# Patient Record
Sex: Male | Born: 1940
Health system: Southern US, Community
[De-identification: ages and names within clinical notes are randomized; demographics above are authoritative.]

## PROBLEM LIST (undated history)

## (undated) DIAGNOSIS — M1990 Unspecified osteoarthritis, unspecified site: Secondary | ICD-10-CM

## (undated) DIAGNOSIS — C439 Malignant melanoma of skin, unspecified: Secondary | ICD-10-CM

## (undated) DIAGNOSIS — I10 Essential (primary) hypertension: Secondary | ICD-10-CM

## (undated) DIAGNOSIS — S4990XA Unspecified injury of shoulder and upper arm, unspecified arm, initial encounter: Secondary | ICD-10-CM

## (undated) DIAGNOSIS — H9190 Unspecified hearing loss, unspecified ear: Secondary | ICD-10-CM

## (undated) DIAGNOSIS — G4730 Sleep apnea, unspecified: Secondary | ICD-10-CM

## (undated) DIAGNOSIS — R0683 Snoring: Secondary | ICD-10-CM

## (undated) DIAGNOSIS — E109 Type 1 diabetes mellitus without complications: Secondary | ICD-10-CM

## (undated) DIAGNOSIS — F419 Anxiety disorder, unspecified: Secondary | ICD-10-CM

## (undated) DIAGNOSIS — R351 Nocturia: Secondary | ICD-10-CM

## (undated) DIAGNOSIS — C61 Malignant neoplasm of prostate: Secondary | ICD-10-CM

## (undated) DIAGNOSIS — N393 Stress incontinence (female) (male): Secondary | ICD-10-CM

## (undated) DIAGNOSIS — M25511 Pain in right shoulder: Secondary | ICD-10-CM

## (undated) DIAGNOSIS — E785 Hyperlipidemia, unspecified: Secondary | ICD-10-CM

## (undated) DIAGNOSIS — K439 Ventral hernia without obstruction or gangrene: Secondary | ICD-10-CM

## (undated) DIAGNOSIS — Z8669 Personal history of other diseases of the nervous system and sense organs: Secondary | ICD-10-CM

## (undated) DIAGNOSIS — N529 Male erectile dysfunction, unspecified: Secondary | ICD-10-CM

## (undated) HISTORY — DX: Stress incontinence (female) (male): N39.3

## (undated) HISTORY — DX: Unspecified hearing loss, unspecified ear: H91.90

## (undated) HISTORY — DX: Male erectile dysfunction, unspecified: N52.9

## (undated) HISTORY — DX: Personal history of other diseases of the nervous system and sense organs: Z86.69

## (undated) HISTORY — DX: Malignant neoplasm of prostate: C61

## (undated) HISTORY — DX: Essential (primary) hypertension: I10

## (undated) HISTORY — PX: HERNIA REPAIR: SHX51

## (undated) HISTORY — DX: Ventral hernia without obstruction or gangrene: K43.9

## (undated) HISTORY — DX: Hyperlipidemia, unspecified: E78.5

## (undated) HISTORY — PX: ROTATOR CUFF REPAIR: SHX139

---

## 1998-07-24 ENCOUNTER — Encounter: Admission: RE | Admit: 1998-07-24 | Discharge: 1998-10-22 | Payer: Self-pay | Admitting: Endocrinology

## 1998-11-14 ENCOUNTER — Encounter: Payer: Self-pay | Admitting: Orthopedic Surgery

## 1998-11-14 ENCOUNTER — Ambulatory Visit (HOSPITAL_COMMUNITY): Admission: RE | Admit: 1998-11-14 | Discharge: 1998-11-14 | Payer: Self-pay | Admitting: Orthopedic Surgery

## 1999-08-09 ENCOUNTER — Ambulatory Visit: Admission: RE | Admit: 1999-08-09 | Discharge: 1999-08-09 | Payer: Self-pay | Admitting: Internal Medicine

## 2002-06-07 ENCOUNTER — Encounter: Admission: RE | Admit: 2002-06-07 | Discharge: 2002-09-05 | Payer: Self-pay | Admitting: Endocrinology

## 2002-09-29 ENCOUNTER — Encounter: Admission: RE | Admit: 2002-09-29 | Discharge: 2002-12-28 | Payer: Self-pay | Admitting: Endocrinology

## 2003-02-14 ENCOUNTER — Encounter: Admission: RE | Admit: 2003-02-14 | Discharge: 2003-05-15 | Payer: Self-pay | Admitting: Endocrinology

## 2006-06-26 ENCOUNTER — Ambulatory Visit: Payer: Self-pay | Admitting: Vascular Surgery

## 2009-01-17 ENCOUNTER — Ambulatory Visit (HOSPITAL_COMMUNITY): Admission: RE | Admit: 2009-01-17 | Discharge: 2009-01-17 | Payer: Self-pay | Admitting: Urology

## 2009-01-30 ENCOUNTER — Ambulatory Visit: Admission: RE | Admit: 2009-01-30 | Discharge: 2009-04-20 | Payer: Self-pay | Admitting: Radiation Oncology

## 2009-03-21 ENCOUNTER — Encounter (INDEPENDENT_AMBULATORY_CARE_PROVIDER_SITE_OTHER): Payer: Self-pay | Admitting: Urology

## 2009-03-21 ENCOUNTER — Inpatient Hospital Stay (HOSPITAL_COMMUNITY): Admission: RE | Admit: 2009-03-21 | Discharge: 2009-03-24 | Payer: Self-pay | Admitting: Urology

## 2010-02-23 HISTORY — PX: PROSTATE SURGERY: SHX751

## 2010-08-29 LAB — URINALYSIS, ROUTINE W REFLEX MICROSCOPIC
Bilirubin Urine: NEGATIVE
Glucose, UA: 500 mg/dL — AB
Hgb urine dipstick: NEGATIVE
Ketones, ur: NEGATIVE mg/dL
Nitrite: NEGATIVE
Protein, ur: NEGATIVE mg/dL
Specific Gravity, Urine: 1.006 (ref 1.005–1.030)
Urobilinogen, UA: 1 mg/dL (ref 0.0–1.0)
pH: 6.5 (ref 5.0–8.0)

## 2010-08-29 LAB — BASIC METABOLIC PANEL
BUN: 14 mg/dL (ref 6–23)
BUN: 25 mg/dL — ABNORMAL HIGH (ref 6–23)
CO2: 22 mEq/L (ref 19–32)
CO2: 25 mEq/L (ref 19–32)
Calcium: 7.5 mg/dL — ABNORMAL LOW (ref 8.4–10.5)
Calcium: 8.4 mg/dL (ref 8.4–10.5)
Chloride: 103 mEq/L (ref 96–112)
Chloride: 97 mEq/L (ref 96–112)
Chloride: 99 mEq/L (ref 96–112)
Creatinine, Ser: 1.52 mg/dL — ABNORMAL HIGH (ref 0.4–1.5)
Creatinine, Ser: 2.37 mg/dL — ABNORMAL HIGH (ref 0.4–1.5)
GFR calc Af Amer: 31 mL/min — ABNORMAL LOW (ref >60)
GFR calc Af Amer: 33 mL/min — ABNORMAL LOW (ref >60)
GFR calc Af Amer: 56 mL/min — ABNORMAL LOW (ref >60)
GFR calc non Af Amer: 27 mL/min — ABNORMAL LOW (ref >60)
GFR calc non Af Amer: 46 mL/min — ABNORMAL LOW (ref >60)
Glucose, Bld: 265 mg/dL — ABNORMAL HIGH (ref 70–99)
Potassium: 4 mEq/L (ref 3.5–5.1)
Potassium: 4.3 mEq/L (ref 3.5–5.1)
Sodium: 127 mEq/L — ABNORMAL LOW (ref 135–145)
Sodium: 133 mEq/L — ABNORMAL LOW (ref 135–145)

## 2010-08-29 LAB — COMPREHENSIVE METABOLIC PANEL
ALT: 21 U/L (ref 0–53)
AST: 29 U/L (ref 0–37)
Albumin: 3.8 g/dL (ref 3.5–5.2)
Alkaline Phosphatase: 64 U/L (ref 39–117)
Alkaline Phosphatase: 71 U/L (ref 39–117)
BUN: 13 mg/dL (ref 6–23)
BUN: 25 mg/dL — ABNORMAL HIGH (ref 6–23)
BUN: 27 mg/dL — ABNORMAL HIGH (ref 6–23)
CO2: 23 mEq/L (ref 19–32)
CO2: 28 mEq/L (ref 19–32)
Calcium: 8 mg/dL — ABNORMAL LOW (ref 8.4–10.5)
Calcium: 9.1 mg/dL (ref 8.4–10.5)
Chloride: 100 mEq/L (ref 96–112)
Chloride: 99 mEq/L (ref 96–112)
Creatinine, Ser: 1.18 mg/dL (ref 0.4–1.5)
Creatinine, Ser: 2.62 mg/dL — ABNORMAL HIGH (ref 0.4–1.5)
Creatinine, Ser: 2.8 mg/dL — ABNORMAL HIGH (ref 0.4–1.5)
GFR calc Af Amer: >60 mL/min (ref >60)
GFR calc non Af Amer: 25 mL/min — ABNORMAL LOW (ref >60)
GFR calc non Af Amer: >60 mL/min (ref >60)
Glucose, Bld: 251 mg/dL — ABNORMAL HIGH (ref 70–99)
Glucose, Bld: 335 mg/dL — ABNORMAL HIGH (ref 70–99)
Potassium: 3.8 mEq/L (ref 3.5–5.1)
Potassium: 3.8 mEq/L (ref 3.5–5.1)
Sodium: 135 mEq/L (ref 135–145)
Total Bilirubin: 0.6 mg/dL (ref 0.3–1.2)
Total Bilirubin: 1 mg/dL (ref 0.3–1.2)
Total Protein: 6.5 g/dL (ref 6.0–8.3)
Total Protein: 7.6 g/dL (ref 6.0–8.3)

## 2010-08-29 LAB — CBC
HCT: 36.2 % — ABNORMAL LOW (ref 39.0–52.0)
HCT: 37.1 % — ABNORMAL LOW (ref 39.0–52.0)
Hemoglobin: 12.8 g/dL — ABNORMAL LOW (ref 13.0–17.0)
MCHC: 35.4 g/dL (ref 30.0–36.0)
MCV: 91.6 fL (ref 78.0–100.0)
MCV: 92 fL (ref 78.0–100.0)
MCV: 92.2 fL (ref 78.0–100.0)
Platelets: 110 10*3/uL — ABNORMAL LOW (ref 150–400)
Platelets: 122 10*3/uL — ABNORMAL LOW (ref 150–400)
Platelets: 142 10*3/uL — ABNORMAL LOW (ref 150–400)
RBC: 3.5 MIL/uL — ABNORMAL LOW (ref 4.22–5.81)
RBC: 3.92 MIL/uL — ABNORMAL LOW (ref 4.22–5.81)
RBC: 4.25 MIL/uL (ref 4.22–5.81)
RDW: 13.1 % (ref 11.5–15.5)
WBC: 10.4 10*3/uL (ref 4.0–10.5)
WBC: 7 10*3/uL (ref 4.0–10.5)
WBC: 9.4 10*3/uL (ref 4.0–10.5)

## 2010-08-29 LAB — ABO/RH: ABO/RH(D): O POS

## 2010-08-29 LAB — GLUCOSE, CAPILLARY
Glucose-Capillary: 128 mg/dL — ABNORMAL HIGH (ref 70–99)
Glucose-Capillary: 144 mg/dL — ABNORMAL HIGH (ref 70–99)
Glucose-Capillary: 146 mg/dL — ABNORMAL HIGH (ref 70–99)
Glucose-Capillary: 155 mg/dL — ABNORMAL HIGH (ref 70–99)
Glucose-Capillary: 164 mg/dL — ABNORMAL HIGH (ref 70–99)
Glucose-Capillary: 167 mg/dL — ABNORMAL HIGH (ref 70–99)
Glucose-Capillary: 170 mg/dL — ABNORMAL HIGH (ref 70–99)
Glucose-Capillary: 189 mg/dL — ABNORMAL HIGH (ref 70–99)
Glucose-Capillary: 205 mg/dL — ABNORMAL HIGH (ref 70–99)
Glucose-Capillary: 226 mg/dL — ABNORMAL HIGH (ref 70–99)
Glucose-Capillary: 228 mg/dL — ABNORMAL HIGH (ref 70–99)
Glucose-Capillary: 235 mg/dL — ABNORMAL HIGH (ref 70–99)
Glucose-Capillary: 236 mg/dL — ABNORMAL HIGH (ref 70–99)
Glucose-Capillary: 254 mg/dL — ABNORMAL HIGH (ref 70–99)
Glucose-Capillary: 266 mg/dL — ABNORMAL HIGH (ref 70–99)
Glucose-Capillary: 273 mg/dL — ABNORMAL HIGH (ref 70–99)
Glucose-Capillary: 321 mg/dL — ABNORMAL HIGH (ref 70–99)
Glucose-Capillary: 333 mg/dL — ABNORMAL HIGH (ref 70–99)
Glucose-Capillary: 370 mg/dL — ABNORMAL HIGH (ref 70–99)
Glucose-Capillary: 378 mg/dL — ABNORMAL HIGH (ref 70–99)
Glucose-Capillary: 390 mg/dL — ABNORMAL HIGH (ref 70–99)

## 2010-08-29 LAB — DIFFERENTIAL
Eosinophils Absolute: 0 10*3/uL (ref 0.0–0.7)
Eosinophils Relative: 0 % (ref 0–5)
Lymphocytes Relative: 6 % — ABNORMAL LOW (ref 12–46)
Lymphs Abs: 0.4 10*3/uL — ABNORMAL LOW (ref 0.7–4.0)
Monocytes Absolute: 0.6 10*3/uL (ref 0.1–1.0)

## 2010-08-29 LAB — TYPE AND SCREEN: ABO/RH(D): O POS

## 2010-08-29 LAB — PROTIME-INR
INR: 0.98 (ref 0.00–1.49)
Prothrombin Time: 12.9 seconds (ref 11.6–15.2)

## 2011-04-28 HISTORY — PX: EYE SURGERY: SHX253

## 2011-06-12 DIAGNOSIS — R05 Cough: Secondary | ICD-10-CM | POA: Diagnosis not present

## 2011-06-12 DIAGNOSIS — E1029 Type 1 diabetes mellitus with other diabetic kidney complication: Secondary | ICD-10-CM | POA: Diagnosis not present

## 2011-06-12 DIAGNOSIS — J069 Acute upper respiratory infection, unspecified: Secondary | ICD-10-CM | POA: Diagnosis not present

## 2011-06-12 DIAGNOSIS — R509 Fever, unspecified: Secondary | ICD-10-CM | POA: Diagnosis not present

## 2011-06-25 DIAGNOSIS — H35379 Puckering of macula, unspecified eye: Secondary | ICD-10-CM | POA: Diagnosis not present

## 2011-06-25 DIAGNOSIS — H3581 Retinal edema: Secondary | ICD-10-CM | POA: Diagnosis not present

## 2011-06-27 DIAGNOSIS — E1029 Type 1 diabetes mellitus with other diabetic kidney complication: Secondary | ICD-10-CM | POA: Diagnosis not present

## 2011-06-27 DIAGNOSIS — J209 Acute bronchitis, unspecified: Secondary | ICD-10-CM | POA: Diagnosis not present

## 2011-06-27 DIAGNOSIS — J069 Acute upper respiratory infection, unspecified: Secondary | ICD-10-CM | POA: Diagnosis not present

## 2011-06-27 DIAGNOSIS — N058 Unspecified nephritic syndrome with other morphologic changes: Secondary | ICD-10-CM | POA: Diagnosis not present

## 2011-08-19 DIAGNOSIS — I10 Essential (primary) hypertension: Secondary | ICD-10-CM | POA: Diagnosis not present

## 2011-08-19 DIAGNOSIS — D126 Benign neoplasm of colon, unspecified: Secondary | ICD-10-CM | POA: Diagnosis not present

## 2011-08-19 DIAGNOSIS — E785 Hyperlipidemia, unspecified: Secondary | ICD-10-CM | POA: Diagnosis not present

## 2011-08-19 DIAGNOSIS — E1029 Type 1 diabetes mellitus with other diabetic kidney complication: Secondary | ICD-10-CM | POA: Diagnosis not present

## 2011-08-20 ENCOUNTER — Encounter (INDEPENDENT_AMBULATORY_CARE_PROVIDER_SITE_OTHER): Payer: Self-pay | Admitting: General Surgery

## 2011-08-20 DIAGNOSIS — H2510 Age-related nuclear cataract, unspecified eye: Secondary | ICD-10-CM | POA: Diagnosis not present

## 2011-08-20 DIAGNOSIS — E1139 Type 2 diabetes mellitus with other diabetic ophthalmic complication: Secondary | ICD-10-CM | POA: Diagnosis not present

## 2011-08-20 DIAGNOSIS — E11339 Type 2 diabetes mellitus with moderate nonproliferative diabetic retinopathy without macular edema: Secondary | ICD-10-CM | POA: Diagnosis not present

## 2011-08-20 DIAGNOSIS — H35379 Puckering of macula, unspecified eye: Secondary | ICD-10-CM | POA: Diagnosis not present

## 2011-08-21 ENCOUNTER — Encounter (INDEPENDENT_AMBULATORY_CARE_PROVIDER_SITE_OTHER): Payer: Self-pay | Admitting: General Surgery

## 2011-08-21 ENCOUNTER — Ambulatory Visit (INDEPENDENT_AMBULATORY_CARE_PROVIDER_SITE_OTHER): Payer: Medicare Other | Admitting: General Surgery

## 2011-08-21 VITALS — BP 172/66 | HR 79 | Temp 97.6°F | Ht 67.0 in | Wt 179.6 lb

## 2011-08-21 DIAGNOSIS — K439 Ventral hernia without obstruction or gangrene: Secondary | ICD-10-CM | POA: Diagnosis not present

## 2011-08-21 NOTE — Progress Notes (Signed)
Subjective:     Patient ID: Eric [name], male   DOB: [birth year], 71 y.o.   MRN: [id number]  HPI We're asked to see the patient in consultation by Dr. Darvin Neighbours to evaluate him for a ventral hernia. The patient is a 71 year old white male who had robotic prostate surgery for prostate cancer about 2 years ago. Shortly thereafter he started noticing a bulge just above his umbilicus at one of the port sites. He recently had an upper respiratory infection and a lot of coughing and noticed that the bulge was getting a little larger. He denies any pain. He denies any abdominal bloating. He denies any nausea and vomiting.  Review of Systems  Constitutional: Negative.   HENT: Negative.   Eyes: Negative.   Respiratory: Negative.   Cardiovascular: Negative.   Gastrointestinal: Negative.   Genitourinary: Negative.   Musculoskeletal: Negative.   Skin: Negative.   Neurological: Negative.   Hematological: Negative.   Psychiatric/Behavioral: Negative.        Objective:   Physical Exam  Constitutional: He is oriented to person, place, and time. He appears well-developed and well-nourished.  HENT:  Head: Normocephalic and atraumatic.  Eyes: Conjunctivae and EOM are normal. Pupils are equal, round, and reactive to light.  Neck: Normal range of motion. Neck supple.  Cardiovascular: Normal rate, regular rhythm and normal heart sounds.   Pulmonary/Chest: Effort normal and breath sounds normal.  Abdominal: Soft. Bowel sounds are normal.       He has a moderate sized ventral hernia at his supraumbilical port site that is easily reducible.  Musculoskeletal: Normal range of motion.  Neurological: He is alert and oriented to person, place, and time.  Skin: Skin is warm and dry.  Psychiatric: He has a normal mood and affect. His behavior is normal.       Assessment:     Moderate size ventral hernia that recently got a little larger after an upper respiratory infection    Plan:     Because of the  risk of incarceration and strangulation and the fact that the natural history of these hernias is to gradually enlarge think he would benefit from having it fixed. He would also like to have this done. I've discussed in detail the risks and benefits of the operation to fix the hernia as well as some of the technical aspects he understands and wishes to proceed. I think he would be a good laparoscopic candidate. 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 As long as he does not seem to have any symptoms I think that would be fine. He agrees to call if he starts having pain bloating and nausea and vomiting. He will also call and is ready to schedule.

## 2011-08-21 NOTE — Patient Instructions (Signed)
Call when you are ready to schedule laparoscopic assisted ventral hernia repair with mesh

## 2011-09-02 DIAGNOSIS — R05 Cough: Secondary | ICD-10-CM | POA: Diagnosis not present

## 2011-09-02 DIAGNOSIS — R109 Unspecified abdominal pain: Secondary | ICD-10-CM | POA: Diagnosis not present

## 2011-09-02 DIAGNOSIS — IMO0001 Reserved for inherently not codable concepts without codable children: Secondary | ICD-10-CM | POA: Diagnosis not present

## 2011-09-02 DIAGNOSIS — J069 Acute upper respiratory infection, unspecified: Secondary | ICD-10-CM | POA: Diagnosis not present

## 2011-09-03 DIAGNOSIS — K429 Umbilical hernia without obstruction or gangrene: Secondary | ICD-10-CM | POA: Diagnosis not present

## 2011-09-03 DIAGNOSIS — R109 Unspecified abdominal pain: Secondary | ICD-10-CM | POA: Diagnosis not present

## 2011-09-22 DIAGNOSIS — R221 Localized swelling, mass and lump, neck: Secondary | ICD-10-CM | POA: Diagnosis not present

## 2011-09-22 DIAGNOSIS — X58XXXA Exposure to other specified factors, initial encounter: Secondary | ICD-10-CM | POA: Diagnosis not present

## 2011-09-22 DIAGNOSIS — T783XXA Angioneurotic edema, initial encounter: Secondary | ICD-10-CM | POA: Diagnosis not present

## 2011-09-22 DIAGNOSIS — R220 Localized swelling, mass and lump, head: Secondary | ICD-10-CM | POA: Diagnosis not present

## 2011-10-03 DIAGNOSIS — T7800XA Anaphylactic reaction due to unspecified food, initial encounter: Secondary | ICD-10-CM | POA: Diagnosis not present

## 2011-10-03 DIAGNOSIS — T7840XA Allergy, unspecified, initial encounter: Secondary | ICD-10-CM | POA: Diagnosis not present

## 2011-10-03 DIAGNOSIS — L500 Allergic urticaria: Secondary | ICD-10-CM | POA: Diagnosis not present

## 2011-10-03 DIAGNOSIS — T783XXA Angioneurotic edema, initial encounter: Secondary | ICD-10-CM | POA: Diagnosis not present

## 2011-10-15 DIAGNOSIS — T782XXA Anaphylactic shock, unspecified, initial encounter: Secondary | ICD-10-CM | POA: Diagnosis not present

## 2011-10-15 DIAGNOSIS — I10 Essential (primary) hypertension: Secondary | ICD-10-CM | POA: Diagnosis not present

## 2011-11-03 DIAGNOSIS — C61 Malignant neoplasm of prostate: Secondary | ICD-10-CM | POA: Diagnosis not present

## 2011-11-03 DIAGNOSIS — N393 Stress incontinence (female) (male): Secondary | ICD-10-CM | POA: Diagnosis not present

## 2011-11-03 DIAGNOSIS — R972 Elevated prostate specific antigen [PSA]: Secondary | ICD-10-CM | POA: Diagnosis not present

## 2011-11-18 DIAGNOSIS — H35379 Puckering of macula, unspecified eye: Secondary | ICD-10-CM | POA: Diagnosis not present

## 2011-11-18 DIAGNOSIS — E1139 Type 2 diabetes mellitus with other diabetic ophthalmic complication: Secondary | ICD-10-CM | POA: Diagnosis not present

## 2011-11-18 DIAGNOSIS — E11339 Type 2 diabetes mellitus with moderate nonproliferative diabetic retinopathy without macular edema: Secondary | ICD-10-CM | POA: Diagnosis not present

## 2011-11-18 DIAGNOSIS — H3581 Retinal edema: Secondary | ICD-10-CM | POA: Diagnosis not present

## 2011-11-25 ENCOUNTER — Encounter (INDEPENDENT_AMBULATORY_CARE_PROVIDER_SITE_OTHER): Payer: Medicare Other | Admitting: General Surgery

## 2011-11-26 DIAGNOSIS — N401 Enlarged prostate with lower urinary tract symptoms: Secondary | ICD-10-CM | POA: Diagnosis not present

## 2011-11-26 DIAGNOSIS — E785 Hyperlipidemia, unspecified: Secondary | ICD-10-CM | POA: Diagnosis not present

## 2011-11-26 DIAGNOSIS — N058 Unspecified nephritic syndrome with other morphologic changes: Secondary | ICD-10-CM | POA: Diagnosis not present

## 2011-11-26 DIAGNOSIS — E1029 Type 1 diabetes mellitus with other diabetic kidney complication: Secondary | ICD-10-CM | POA: Diagnosis not present

## 2011-12-11 ENCOUNTER — Ambulatory Visit (INDEPENDENT_AMBULATORY_CARE_PROVIDER_SITE_OTHER): Payer: Medicare Other | Admitting: General Surgery

## 2011-12-11 ENCOUNTER — Encounter (INDEPENDENT_AMBULATORY_CARE_PROVIDER_SITE_OTHER): Payer: Self-pay | Admitting: General Surgery

## 2011-12-11 VITALS — BP 141/80 | HR 72 | Temp 98.6°F | Resp 18 | Ht 67.0 in | Wt 180.6 lb

## 2011-12-11 DIAGNOSIS — K439 Ventral hernia without obstruction or gangrene: Secondary | ICD-10-CM

## 2011-12-11 NOTE — Progress Notes (Signed)
Subjective:     Patient ID: Eric [name], male   DOB: [birth year], 71 y.o.   MRN: [id number]  HPI The patient is a 71 year old white male who we saw a few months ago with a ventral hernia. He has a history of a robotic prostate surgery and the hernia is at his supraumbilical port site. He denies any abdominal pain. He denies any nausea or vomiting. He comes in today because he is ready to schedule surgery.  Review of Systems  Constitutional: Negative.   HENT: Negative.   Eyes: Negative.   Respiratory: Negative.   Cardiovascular: Negative.   Gastrointestinal: Negative.   Genitourinary: Negative.   Musculoskeletal: Negative.   Skin: Negative.   Neurological: Negative.   Hematological: Negative.   Psychiatric/Behavioral: Negative.        Objective:   Physical Exam  Constitutional: He is oriented to person, place, and time. He appears well-developed and well-nourished.  HENT:  Head: Normocephalic and atraumatic.  Eyes: Conjunctivae and EOM are normal. Pupils are equal, round, and reactive to light.  Neck: Normal range of motion. Neck supple.  Cardiovascular: Normal rate, regular rhythm and normal heart sounds.   Pulmonary/Chest: Effort normal and breath sounds normal.  Abdominal: Soft. Bowel sounds are normal.       The patient has an easily reducible ventral hernia at his supraumbilical port site. I can palpate the fascial defect.  Musculoskeletal: Normal range of motion.  Neurological: He is alert and oriented to person, place, and time.  Skin: Skin is warm and dry.  Psychiatric: He has a normal mood and affect. His behavior is normal.       Assessment:     The patient has a ventral hernia. I think he would be a good candidate for a laparoscopic ventral hernia repair with mesh. I have discussed with him in detail the risks and benefits of the operation of this as well as some of the technical aspects and he understands and wishes to proceed.    Plan:     Plan for  laparoscopic ventral hernia repair with mesh.

## 2011-12-17 ENCOUNTER — Encounter (HOSPITAL_COMMUNITY): Payer: Self-pay | Admitting: Respiratory Therapy

## 2011-12-25 ENCOUNTER — Encounter (HOSPITAL_COMMUNITY): Payer: Self-pay

## 2011-12-25 ENCOUNTER — Encounter (HOSPITAL_COMMUNITY)
Admission: RE | Admit: 2011-12-25 | Discharge: 2011-12-25 | Disposition: A | Discharge status: Home / Self Care | Payer: Medicare Other | Source: Ambulatory Visit | Attending: General Surgery | Admitting: General Surgery

## 2011-12-25 DIAGNOSIS — Z01812 Encounter for preprocedural laboratory examination: Secondary | ICD-10-CM | POA: Diagnosis not present

## 2011-12-25 DIAGNOSIS — N320 Bladder-neck obstruction: Secondary | ICD-10-CM | POA: Diagnosis not present

## 2011-12-25 DIAGNOSIS — K439 Ventral hernia without obstruction or gangrene: Secondary | ICD-10-CM | POA: Diagnosis not present

## 2011-12-25 DIAGNOSIS — Z01811 Encounter for preprocedural respiratory examination: Secondary | ICD-10-CM | POA: Diagnosis not present

## 2011-12-25 DIAGNOSIS — N368 Other specified disorders of urethra: Secondary | ICD-10-CM | POA: Diagnosis not present

## 2011-12-25 HISTORY — DX: Nocturia: R35.1

## 2011-12-25 HISTORY — DX: Unspecified injury of shoulder and upper arm, unspecified arm, initial encounter: S49.90XA

## 2011-12-25 HISTORY — DX: Anxiety disorder, unspecified: F41.9

## 2011-12-25 HISTORY — DX: Snoring: R06.83

## 2011-12-25 HISTORY — DX: Pain in right shoulder: M25.511

## 2011-12-25 LAB — SURGICAL PCR SCREEN
MRSA, PCR: NEGATIVE
Staphylococcus aureus: NEGATIVE

## 2011-12-25 LAB — CBC
HCT: 40.9 % (ref 39.0–52.0)
MCHC: 33.7 g/dL (ref 30.0–36.0)
MCV: 85.6 fL (ref 78.0–100.0)
RDW: 14.3 % (ref 11.5–15.5)

## 2011-12-25 LAB — BASIC METABOLIC PANEL WITH GFR
BUN: 20 mg/dL (ref 6–23)
CO2: 26 meq/L (ref 19–32)
Calcium: 9.6 mg/dL (ref 8.4–10.5)
Chloride: 101 meq/L (ref 96–112)
Creatinine, Ser: 1.06 mg/dL (ref 0.50–1.35)
GFR calc Af Amer: 81 mL/min — ABNORMAL LOW
GFR calc non Af Amer: 70 mL/min — ABNORMAL LOW
Glucose, Bld: 206 mg/dL — ABNORMAL HIGH (ref 70–99)
Potassium: 4.8 meq/L (ref 3.5–5.1)
Sodium: 138 meq/L (ref 135–145)

## 2011-12-25 MED ORDER — CHLORHEXIDINE GLUCONATE 4 % EX LIQD: 1.0000 "application " | Freq: Once | CUTANEOUS | Status: DC

## 2011-12-25 NOTE — Pre-Procedure Instructions (Signed)
20 TRI CHITTICK  12/25/2011   Your procedure is scheduled on:  12/31/11  Wednesday  Report to [facility] at 0630 AM.  Call this number if you have problems the morning of surgery: [phone]   Remember:   Do not eat food:After Midnight.  May have clear liquids:until Midnight .  Clear liquids include soda, tea, black coffee, apple or grape juice, broth.  Take these medicines the morning of surgery with A SIP OF WATER: norvasc  celexa  Insulin pump on basal rate  No boluses day of surgery .   Do not wear jewelry, make-up or nail polish.  Do not wear lotions, powders, or perfumes. You may wear deodorant.  Do not shave 48 hours prior to surgery. Men may shave face and neck.  Do not bring valuables to the hospital.  Contacts, dentures or bridgework may not be worn into surgery.  Leave suitcase in the car. After surgery it may be brought to your room.  For patients admitted to the hospital, checkout time is 11:00 AM the day of discharge.   Patients discharged the day of surgery will not be allowed to drive home.  Name and phone number of your driver: wife  Special Instructions: CHG Shower Use Special Wash: 1/2 bottle night before surgery and 1/2 bottle morning of surgery.   Please read over the following fact sheets that you were given: Pain Booklet, Coughing and Deep Breathing, Lab Information, MRSA Information and Surgical Site Infection Prevention

## 2011-12-25 NOTE — Progress Notes (Signed)
Pt will leave insulin pump on basal rate the  Morning of surgery.  And will not bolus himself... He was also asked to call Dr Evlyn Kanner to advise him of upcoming surgery ,and management of the pump .Marland Kitchen

## 2011-12-26 NOTE — Consult Note (Signed)
Anesthesia chart review: Patient is a 71 year old male scheduled for laparoscopic ventral hernia repair with mesh by Dr. Carolynne Edouard on 12/31/11. History includes nonsmoker, hypertension, prostate cancer s/p robotic surgery '10, anxiety, blurred vision, hyperlipidemia, diabetes mellitus, with insulin pump, snoring disorder.  PCP is Dr. Evlyn Kanner.    CXR on 12/25/11 showed: No active disease. Mild compression deformity mid thoracic spine of indeterminate age. Clinical correlation is necessary. Epic indicates Dr. Carolynne Edouard has already reviewed this result.   EKG on 12/25/11 showed SB @ 55 bpm with first degree AVB.   Labs noted.  He plans to leave his insulin pump on his basal rate the morning of surgery.  Check CBG on arrival.  Anticipate he can proceed as planned.  Shonna Chock, PA-C

## 2011-12-30 MED ORDER — CEFAZOLIN SODIUM-DEXTROSE 2-3 GM-% IV SOLR
2.0000 g | INTRAVENOUS | Status: AC
  Administered 2012-01-06: 2 g via INTRAVENOUS
  Filled 2011-12-30: qty 50

## 2011-12-31 ENCOUNTER — Telehealth (INDEPENDENT_AMBULATORY_CARE_PROVIDER_SITE_OTHER): Payer: Self-pay | Admitting: General Surgery

## 2011-12-31 NOTE — Telephone Encounter (Signed)
Pt wife called in stating that the surgery schedule for 01/02/12 8:00 or 8:30 was moved to 11:30. She was concerned about his blood sugar levels since they have to travel from [place]. She stated that she called the hospital and they told her to call [facility]. Call placed on hold and I called pre-admit at [facility] directly. Alcario Drought advised that the wife was already told to eat a healthy good size meal for dinner and a healthy snack and that that would be enough to keep the blood sugar levels balanced and that his levels would be checked at the hospital prior to surgery. Information was repeated to the wife who again expressed concern about his blood sugar level. Advised her the hospital indicated that is the protocol for all diabetics and that if he could eat the snack late at 11:30 or 11:45 and that should help as well.

## 2012-01-01 NOTE — Progress Notes (Signed)
NOTIFIED PATIENT OF TIME CHANG, WILL ARRIVE AT 930 AM  01-02-12.

## 2012-01-05 NOTE — Progress Notes (Signed)
Left message on machine that time of surgery has changed again and he needs to arrive at 0730 AM.  Ask if he would call [phone] to confirm that he received message.

## 2012-01-05 NOTE — Progress Notes (Addendum)
Wife called to confirm that pt would be here at 0730

## 2012-01-06 ENCOUNTER — Encounter (HOSPITAL_COMMUNITY)
Admission: RE | Disposition: A | Discharge status: Home / Self Care | Payer: Self-pay | Source: Ambulatory Visit | Attending: General Surgery

## 2012-01-06 ENCOUNTER — Encounter (HOSPITAL_COMMUNITY): Payer: Self-pay

## 2012-01-06 ENCOUNTER — Inpatient Hospital Stay (HOSPITAL_COMMUNITY)
Admission: RE | Admit: 2012-01-06 | Discharge: 2012-01-09 | DRG: 355 | Disposition: A | Discharge status: Home / Self Care | Payer: Medicare Other | Source: Ambulatory Visit | Attending: General Surgery | Admitting: General Surgery

## 2012-01-06 ENCOUNTER — Encounter (HOSPITAL_COMMUNITY): Payer: Self-pay | Admitting: Vascular Surgery

## 2012-01-06 ENCOUNTER — Encounter (HOSPITAL_COMMUNITY): Payer: Self-pay | Admitting: Anesthesiology

## 2012-01-06 ENCOUNTER — Ambulatory Visit (HOSPITAL_COMMUNITY): Payer: Medicare Other | Admitting: Vascular Surgery

## 2012-01-06 DIAGNOSIS — R109 Unspecified abdominal pain: Secondary | ICD-10-CM | POA: Diagnosis not present

## 2012-01-06 DIAGNOSIS — Z01812 Encounter for preprocedural laboratory examination: Secondary | ICD-10-CM | POA: Diagnosis not present

## 2012-01-06 DIAGNOSIS — E119 Type 2 diabetes mellitus without complications: Secondary | ICD-10-CM | POA: Diagnosis present

## 2012-01-06 DIAGNOSIS — N320 Bladder-neck obstruction: Secondary | ICD-10-CM | POA: Diagnosis present

## 2012-01-06 DIAGNOSIS — N368 Other specified disorders of urethra: Secondary | ICD-10-CM | POA: Diagnosis present

## 2012-01-06 DIAGNOSIS — K439 Ventral hernia without obstruction or gangrene: Secondary | ICD-10-CM | POA: Diagnosis not present

## 2012-01-06 DIAGNOSIS — R339 Retention of urine, unspecified: Secondary | ICD-10-CM | POA: Diagnosis not present

## 2012-01-06 DIAGNOSIS — E785 Hyperlipidemia, unspecified: Secondary | ICD-10-CM | POA: Diagnosis present

## 2012-01-06 DIAGNOSIS — I10 Essential (primary) hypertension: Secondary | ICD-10-CM | POA: Diagnosis not present

## 2012-01-06 DIAGNOSIS — C61 Malignant neoplasm of prostate: Secondary | ICD-10-CM | POA: Diagnosis not present

## 2012-01-06 HISTORY — PX: CYSTOSCOPY: SHX5120

## 2012-01-06 HISTORY — PX: VENTRAL HERNIA REPAIR: SHX424

## 2012-01-06 HISTORY — DX: Type 1 diabetes mellitus without complications: E10.9

## 2012-01-06 HISTORY — PX: CYSTOSCOPY WITH URETHRAL DILATATION: SHX5125

## 2012-01-06 LAB — GLUCOSE, CAPILLARY: Glucose-Capillary: 141 mg/dL — ABNORMAL HIGH (ref 70–99)

## 2012-01-06 SURGERY — REPAIR, HERNIA, VENTRAL, LAPAROSCOPIC
Anesthesia: General | Wound class: Clean

## 2012-01-06 MED ORDER — HYDROMORPHONE HCL PF 1 MG/ML IJ SOLN
INTRAMUSCULAR | Status: AC
  Filled 2012-01-06: qty 1

## 2012-01-06 MED ORDER — ASPIRIN 81 MG PO TABS: 81.0000 mg | ORAL_TABLET | Freq: Every day | ORAL | Status: DC

## 2012-01-06 MED ORDER — ACETAMINOPHEN 10 MG/ML IV SOLN
INTRAVENOUS | Status: AC
  Administered 2012-01-06: 1000 mg
  Filled 2012-01-06: qty 100

## 2012-01-06 MED ORDER — PROMETHAZINE HCL 25 MG/ML IJ SOLN: 6.2500 mg | INTRAMUSCULAR | Status: DC | PRN

## 2012-01-06 MED ORDER — SODIUM CHLORIDE 0.9 % IR SOLN
Status: DC | PRN
  Administered 2012-01-06: 1000 mL

## 2012-01-06 MED ORDER — SODIUM CHLORIDE 0.9 % IV SOLN
INTRAVENOUS | Status: DC
  Administered 2012-01-06: 08:00:00 via INTRAVENOUS

## 2012-01-06 MED ORDER — INSULIN ASPART 100 UNIT/ML ~~LOC~~ SOLN
7.0000 [IU] | Freq: Once | SUBCUTANEOUS | Status: AC
  Administered 2012-01-06: 7 [IU] via SUBCUTANEOUS

## 2012-01-06 MED ORDER — KCL IN DEXTROSE-NACL 20-5-0.9 MEQ/L-%-% IV SOLN
INTRAVENOUS | Status: DC
  Administered 2012-01-06: 18:00:00 via INTRAVENOUS
  Administered 2012-01-07: 75 mL/h via INTRAVENOUS
  Filled 2012-01-06 (×3): qty 1000

## 2012-01-06 MED ORDER — ONDANSETRON HCL 4 MG/2ML IJ SOLN
INTRAMUSCULAR | Status: DC | PRN
  Administered 2012-01-06: 4 mg via INTRAVENOUS

## 2012-01-06 MED ORDER — OXYCODONE HCL 5 MG PO TABS
10.0000 mg | ORAL_TABLET | Freq: Once | ORAL | Status: AC
  Administered 2012-01-06: 10 mg via ORAL

## 2012-01-06 MED ORDER — VECURONIUM BROMIDE 10 MG IV SOLR
INTRAVENOUS | Status: DC | PRN
  Administered 2012-01-06: 2 mg via INTRAVENOUS

## 2012-01-06 MED ORDER — HYDROMORPHONE HCL PF 1 MG/ML IJ SOLN
0.2500 mg | INTRAMUSCULAR | Status: DC | PRN
  Administered 2012-01-06 (×4): 0.5 mg via INTRAVENOUS

## 2012-01-06 MED ORDER — EPHEDRINE SULFATE 50 MG/ML IJ SOLN
INTRAMUSCULAR | Status: DC | PRN
  Administered 2012-01-06: 10 mg via INTRAVENOUS
  Administered 2012-01-06 (×2): 5 mg via INTRAVENOUS
  Administered 2012-01-06: 10 mg via INTRAVENOUS

## 2012-01-06 MED ORDER — INSULIN PUMP
Freq: Three times a day (TID) | SUBCUTANEOUS | Status: DC
  Administered 2012-01-07 (×3): via SUBCUTANEOUS
  Administered 2012-01-08: 13.4 via SUBCUTANEOUS
  Administered 2012-01-08: 11 via SUBCUTANEOUS
  Administered 2012-01-08: 13.5 via SUBCUTANEOUS
  Administered 2012-01-09: 08:00:00 via SUBCUTANEOUS
  Filled 2012-01-06: qty 1

## 2012-01-06 MED ORDER — ACETAMINOPHEN 10 MG/ML IV SOLN: 1000.0000 mg | Freq: Once | INTRAVENOUS | Status: DC

## 2012-01-06 MED ORDER — ONDANSETRON HCL 4 MG PO TABS: 4.0000 mg | ORAL_TABLET | Freq: Four times a day (QID) | ORAL | Status: DC | PRN

## 2012-01-06 MED ORDER — LIDOCAINE HCL (CARDIAC) 20 MG/ML IV SOLN
INTRAVENOUS | Status: DC | PRN
  Administered 2012-01-06: 80 mg via INTRAVENOUS

## 2012-01-06 MED ORDER — KCL IN DEXTROSE-NACL 20-5-0.45 MEQ/L-%-% IV SOLN
INTRAVENOUS | Status: AC
  Administered 2012-01-06: 1000 mL
  Filled 2012-01-06: qty 1000

## 2012-01-06 MED ORDER — AMLODIPINE BESYLATE 5 MG PO TABS
5.0000 mg | ORAL_TABLET | Freq: Every day | ORAL | Status: DC
  Administered 2012-01-07 – 2012-01-09 (×3): 5 mg via ORAL
  Filled 2012-01-06 (×3): qty 1

## 2012-01-06 MED ORDER — MEPERIDINE HCL 25 MG/ML IJ SOLN: 6.2500 mg | INTRAMUSCULAR | Status: DC | PRN

## 2012-01-06 MED ORDER — ASPIRIN EC 81 MG PO TBEC
81.0000 mg | DELAYED_RELEASE_TABLET | Freq: Every day | ORAL | Status: DC
  Administered 2012-01-07 – 2012-01-09 (×3): 81 mg via ORAL
  Filled 2012-01-06 (×3): qty 1

## 2012-01-06 MED ORDER — CITALOPRAM HYDROBROMIDE 20 MG PO TABS
20.0000 mg | ORAL_TABLET | Freq: Every day | ORAL | Status: DC
  Administered 2012-01-07 – 2012-01-09 (×3): 20 mg via ORAL
  Filled 2012-01-06 (×3): qty 1

## 2012-01-06 MED ORDER — ROCURONIUM BROMIDE 100 MG/10ML IV SOLN
INTRAVENOUS | Status: DC | PRN
  Administered 2012-01-06: 50 mg via INTRAVENOUS

## 2012-01-06 MED ORDER — SODIUM CHLORIDE 0.9 % IV SOLN
INTRAVENOUS | Status: DC | PRN
  Administered 2012-01-06 (×2): via INTRAVENOUS

## 2012-01-06 MED ORDER — ATORVASTATIN CALCIUM 20 MG PO TABS
20.0000 mg | ORAL_TABLET | Freq: Every day | ORAL | Status: DC
  Administered 2012-01-06 – 2012-01-08 (×3): 20 mg via ORAL
  Filled 2012-01-06 (×4): qty 1

## 2012-01-06 MED ORDER — HYDROCODONE-ACETAMINOPHEN 5-325 MG PO TABS
1.0000 | ORAL_TABLET | ORAL | Status: DC | PRN
  Administered 2012-01-07 (×2): 2 via ORAL
  Filled 2012-01-06 (×2): qty 2

## 2012-01-06 MED ORDER — OXYCODONE HCL 5 MG PO TABS
ORAL_TABLET | ORAL | Status: AC
  Filled 2012-01-06: qty 2

## 2012-01-06 MED ORDER — MIDAZOLAM HCL 5 MG/5ML IJ SOLN
INTRAMUSCULAR | Status: DC | PRN
  Administered 2012-01-06: 1 mg via INTRAVENOUS

## 2012-01-06 MED ORDER — INSULIN ASPART 100 UNIT/ML ~~LOC~~ SOLN
0.0000 [IU] | Freq: Three times a day (TID) | SUBCUTANEOUS | Status: DC
  Administered 2012-01-06: 15 [IU] via SUBCUTANEOUS

## 2012-01-06 MED ORDER — GLYCOPYRROLATE 0.2 MG/ML IJ SOLN
INTRAMUSCULAR | Status: DC | PRN
  Administered 2012-01-06: .6 mg via INTRAVENOUS
  Administered 2012-01-06: 0.1 mg via INTRAVENOUS
  Administered 2012-01-06: 0.2 mg via INTRAVENOUS

## 2012-01-06 MED ORDER — INSULIN ASPART 100 UNIT/ML ~~LOC~~ SOLN
SUBCUTANEOUS | Status: AC
  Filled 2012-01-06: qty 1

## 2012-01-06 MED ORDER — MORPHINE SULFATE 4 MG/ML IJ SOLN
4.0000 mg | INTRAMUSCULAR | Status: DC | PRN
  Administered 2012-01-06 (×2): 4 mg via INTRAVENOUS
  Filled 2012-01-06 (×2): qty 1

## 2012-01-06 MED ORDER — PROPOFOL 10 MG/ML IV EMUL
INTRAVENOUS | Status: DC | PRN
  Administered 2012-01-06: 170 mg via INTRAVENOUS

## 2012-01-06 MED ORDER — OMEGA-3-ACID ETHYL ESTERS 1 G PO CAPS
2.0000 g | ORAL_CAPSULE | Freq: Two times a day (BID) | ORAL | Status: DC
  Administered 2012-01-07 – 2012-01-08 (×4): 2 g via ORAL
  Filled 2012-01-06 (×11): qty 2

## 2012-01-06 MED ORDER — NEOSTIGMINE METHYLSULFATE 1 MG/ML IJ SOLN
INTRAMUSCULAR | Status: DC | PRN
  Administered 2012-01-06: 4 mg via INTRAVENOUS

## 2012-01-06 MED ORDER — STERILE WATER FOR IRRIGATION IR SOLN
Status: DC | PRN
  Administered 2012-01-06: 1

## 2012-01-06 MED ORDER — ONDANSETRON HCL 4 MG/2ML IJ SOLN: 4.0000 mg | Freq: Four times a day (QID) | INTRAMUSCULAR | Status: DC | PRN

## 2012-01-06 MED ORDER — FENTANYL CITRATE 0.05 MG/ML IJ SOLN
INTRAMUSCULAR | Status: DC | PRN
  Administered 2012-01-06 (×4): 50 ug via INTRAVENOUS

## 2012-01-06 MED ORDER — BUPIVACAINE-EPINEPHRINE 0.25% -1:200000 IJ SOLN
INTRAMUSCULAR | Status: DC | PRN
  Administered 2012-01-06: 19 mL

## 2012-01-06 SURGICAL SUPPLY — 57 items
ADH SKN CLS APL DERMABOND .7 (GAUZE/BANDAGES/DRESSINGS) ×1
APPLIER CLIP LOGIC TI 5 (MISCELLANEOUS) IMPLANT
APPLIER CLIP ROT 10 11.4 M/L (STAPLE)
APR CLP MED LRG 11.4X10 (STAPLE)
APR CLP MED LRG 33X5 (MISCELLANEOUS)
BANDAGE GAUZE ELAST BULKY 4 IN (GAUZE/BANDAGES/DRESSINGS) IMPLANT
BINDER ABD UNIV 12 45-62 (WOUND CARE) IMPLANT
BINDER ABDOMINAL 46IN 62IN (WOUND CARE) ×2
CANISTER SUCTION 2500CC (MISCELLANEOUS) IMPLANT
CATH COUDE FOLEY 5CC 14FR (CATHETERS) ×1 IMPLANT
CATH FOLEY 2W COUNCIL 5CC 16FR (CATHETERS) ×1 IMPLANT
CHLORAPREP W/TINT 26ML (MISCELLANEOUS) ×2 IMPLANT
CLIP APPLIE ROT 10 11.4 M/L (STAPLE) IMPLANT
CLOTH BEACON ORANGE TIMEOUT ST (SAFETY) ×2 IMPLANT
COVER SURGICAL LIGHT HANDLE (MISCELLANEOUS) ×2 IMPLANT
DECANTER SPIKE VIAL GLASS SM (MISCELLANEOUS) ×2 IMPLANT
DERMABOND ADVANCED (GAUZE/BANDAGES/DRESSINGS) ×1
DERMABOND ADVANCED .7 DNX12 (GAUZE/BANDAGES/DRESSINGS) ×1 IMPLANT
DEVICE SECURE STRAP 25 ABSORB (INSTRUMENTS) ×2 IMPLANT
DEVICE TROCAR PUNCTURE CLOSURE (ENDOMECHANICALS) ×2 IMPLANT
DRAPE INCISE IOBAN 66X45 STRL (DRAPES) ×2 IMPLANT
DRAPE UTILITY 15X26 W/TAPE STR (DRAPE) ×4 IMPLANT
ELECT CAUTERY BLADE 6.4 (BLADE) ×2 IMPLANT
ELECT REM PT RETURN 9FT ADLT (ELECTROSURGICAL) ×2
ELECTRODE REM PT RTRN 9FT ADLT (ELECTROSURGICAL) ×1 IMPLANT
GLOVE BIO SURGEON STRL SZ7.5 (GLOVE) ×2 IMPLANT
GOWN STRL NON-REIN LRG LVL3 (GOWN DISPOSABLE) ×6 IMPLANT
GUIDEWIRE STR DUAL SENSOR (WIRE) ×1 IMPLANT
KIT BASIN OR (CUSTOM PROCEDURE TRAY) ×2 IMPLANT
KIT ROOM TURNOVER OR (KITS) ×2 IMPLANT
MARKER SKIN DUAL TIP RULER LAB (MISCELLANEOUS) ×1 IMPLANT
MESH PHYSIO OVAL 10X15CM (Mesh General) ×1 IMPLANT
NDL SPNL 22GX3.5 QUINCKE BK (NEEDLE) ×1 IMPLANT
NEEDLE SPNL 22GX3.5 QUINCKE BK (NEEDLE) ×2 IMPLANT
NS IRRIG 1000ML POUR BTL (IV SOLUTION) ×2 IMPLANT
PACK CYSTOSCOPY (CUSTOM PROCEDURE TRAY) ×1 IMPLANT
PAD ARMBOARD 7.5X6 YLW CONV (MISCELLANEOUS) ×2 IMPLANT
PEN SKIN MARKING BROAD (MISCELLANEOUS) ×2 IMPLANT
PENCIL BUTTON HOLSTER BLD 10FT (ELECTRODE) ×2 IMPLANT
SCALPEL HARMONIC ACE (MISCELLANEOUS) IMPLANT
SCISSORS LAP 5X35 DISP (ENDOMECHANICALS) IMPLANT
SET IRRIG TUBING LAPAROSCOPIC (IRRIGATION / IRRIGATOR) IMPLANT
SLEEVE ENDOPATH XCEL 5M (ENDOMECHANICALS) ×2 IMPLANT
SPONGE GAUZE 4X4 12PLY (GAUZE/BANDAGES/DRESSINGS) ×1 IMPLANT
SUT MNCRL AB 4-0 PS2 18 (SUTURE) ×2 IMPLANT
SUT NOVA NAB DX-16 0-1 5-0 T12 (SUTURE) ×2 IMPLANT
SUT VIC AB 3-0 SH 27 (SUTURE) ×2
SUT VIC AB 3-0 SH 27XBRD (SUTURE) IMPLANT
TOWEL OR 17X24 6PK STRL BLUE (TOWEL DISPOSABLE) ×2 IMPLANT
TOWEL OR 17X26 10 PK STRL BLUE (TOWEL DISPOSABLE) ×2 IMPLANT
TRAY FOLEY CATH 14FR (SET/KITS/TRAYS/PACK) ×2 IMPLANT
TRAY LAPAROSCOPIC (CUSTOM PROCEDURE TRAY) ×2 IMPLANT
TROCAR XCEL BLUNT TIP 100MML (ENDOMECHANICALS) IMPLANT
TROCAR XCEL NON-BLD 11X100MML (ENDOMECHANICALS) ×2 IMPLANT
TROCAR XCEL NON-BLD 5MMX100MML (ENDOMECHANICALS) ×2 IMPLANT
TUBE CONNECTING 12X1/4 (SUCTIONS) ×1 IMPLANT
YANKAUER SUCT BULB TIP NO VENT (SUCTIONS) ×1 IMPLANT

## 2012-01-06 NOTE — Progress Notes (Signed)
Spoke to Dr. Krista Blue regarding patient being type I DM request no labs. NS and SQ insulin only.

## 2012-01-06 NOTE — H&P (View-Only) (Signed)
Subjective:     Patient ID: Eric [name], male   DOB: 03/27/1941, 71 y.o.   MRN: [id number]  HPI The patient is a 71-year-old white male who we saw a few months ago with a ventral hernia. He has a history of a robotic prostate surgery and the hernia is at his supraumbilical port site. He denies any abdominal pain. He denies any nausea or vomiting. He comes in today because he is ready to schedule surgery.  Review of Systems  Constitutional: Negative.   HENT: Negative.   Eyes: Negative.   Respiratory: Negative.   Cardiovascular: Negative.   Gastrointestinal: Negative.   Genitourinary: Negative.   Musculoskeletal: Negative.   Skin: Negative.   Neurological: Negative.   Hematological: Negative.   Psychiatric/Behavioral: Negative.        Objective:   Physical Exam  Constitutional: He is oriented to person, place, and time. He appears well-developed and well-nourished.  HENT:  Head: Normocephalic and atraumatic.  Eyes: Conjunctivae and EOM are normal. Pupils are equal, round, and reactive to light.  Neck: Normal range of motion. Neck supple.  Cardiovascular: Normal rate, regular rhythm and normal heart sounds.   Pulmonary/Chest: Effort normal and breath sounds normal.  Abdominal: Soft. Bowel sounds are normal.       The patient has an easily reducible ventral hernia at his supraumbilical port site. I can palpate the fascial defect.  Musculoskeletal: Normal range of motion.  Neurological: He is alert and oriented to person, place, and time.  Skin: Skin is warm and dry.  Psychiatric: He has a normal mood and affect. His behavior is normal.       Assessment:     The patient has a ventral hernia. I think he would be a good candidate for a laparoscopic ventral hernia repair with mesh. I have discussed with him in detail the risks and benefits of the operation of this as well as some of the technical aspects and he understands and wishes to proceed.    Plan:     Plan for  laparoscopic ventral hernia repair with mesh.      

## 2012-01-06 NOTE — Op Note (Signed)
Pre-operative diagnosis : bladder neck contracture  Postoperative diagnosis:same  Operation:Flex cysto and urethral dilation and passage of 16 F council cath.   Surgeon:  Kathie Rhodes. Patsi Sears, MD  First assistant:none  Anesthesia:  general  Preparation: After appropriate pre-anesthesia, the [pt was brought to the OR and placed upon the table in the supine position and given GET anesthesia. Foley cath could not be placed and Urology was consulted ( see consult note). Armband double checked  Review history: LARP 2010. Had been doing well .Followed by Dr. Earlene Plater. Not seen by [facility] since 2010.   Statement of  Likelihood of Success: Excellent. TIME-OUT observed.:  Procedure: flex cysto identified BNC. Dilated to 42F with [facility] dilators over a guidewire. # 16 Council cath passed. 10cc in balloon.

## 2012-01-06 NOTE — Op Note (Signed)
01/06/2012  12:28 PM  PATIENT:  Eric [name]  71 y.o. male  PRE-OPERATIVE DIAGNOSIS:  Ventral Hernia, bladder neck stricture  POST-OPERATIVE DIAGNOSIS:  Ventral Hernia, bladder neck stricture  PROCEDURE:  Procedure(s) (LRB): LAPAROSCOPIC VENTRAL HERNIA (N/A) INSERTION OF MESH (N/A) CYSTOSCOPY FLEXIBLE (N/A) CYSTOSCOPY WITH URETHRAL DILATATION (N/A)  SURGEON:  Surgeon(s) and Role: Panel 1:    * Robyne Askew, MD - Primary  Panel 2:    * Kathi Ludwig, MD - Primary  PHYSICIAN ASSISTANT:   ASSISTANTS: none   ANESTHESIA:   general  EBL:  Total I/O In: 1500 [I.V.:1500] Out: 250 [Urine:225; Blood:25]  BLOOD ADMINISTERED:none  DRAINS: none   LOCAL MEDICATIONS USED:  MARCAINE     SPECIMEN:  Source of Specimen:  hernia sac  DISPOSITION OF SPECIMEN:  PATHOLOGY  COUNTS:  YES  TOURNIQUET:  * No tourniquets in log *  DICTATION: .Dragon Dictation After informed consent was obtained the patient was brought to the operating room and placed in the supine position the operating table. Initially we were unable to place a Foley catheter due to his previous prostatectomy. We had to consult urology who placed a Foley using a cystoscope. This portion of the procedure will be dictated separately. After adequate induction of general anesthesia the patient's abdomen was prepped with ChloraPrep, allowed to dry, and draped in usual sterile manner. A site was chosen in the left upper quadrant for placement of a 5 mm port. This area was infiltrated with quarter percent Marcaine. A 5 mm Optiview was used to dissect bluntly through the layers of the abdominal wall under direct vision until access was gained to the abdominal cavity. The abdomen was insufflated with carbon dioxide without difficulty. The anterior, wall was free from adhesions. The hernia was identified just above the umbilicus. A spinal needle was used to estimate the size of the hernia defect. We then chose a piece of  Physiomesh that would completely cover the defect. We chose a 10 x 15 cm piece of mesh. A second 5 mm port was placed in the left mid abdomen under direct vision. A third port was placed in the right mid abdomen also under direct vision. 6 #1 Novafil stitches were placed at equidistant points around the edge of the mesh. Next the hernia was opened with a small midline incision through the middle of the hernia defect. The hernia sac was excised sharply with the electrocautery. The mesh was placed intra-abdominally. The fascia was then closed over top of the mesh with interrupted #1 Novafil stitches. The abdomen was then reinsufflated. 6 small stab incisions were made at points that corresponded to the stitches. A suture passer was used to bring the tails of 8 stitch through the appropriate hole. Each of these stitches was then tied down snugly and the tails were removed. We did have some bleeding at the right inferior anchor site but this stopped once the stitch was tied down. Once this was accomplished the mesh appeared to be in good position and snugly up against the abdominal wall. A secure strap tacking device was used to place tacks in between the anchor stitches so that there were no gaps. Once this was accomplished the mesh appeared to be in good position. The abdomen was inspected no other abnormalities were noted. The operative site appeared to be hemostatic. The gas was allowed to escape. The midline incision was then closed with a deep layer of 3-0 Vicryl stitches. The skin incisions were then  closed with interrupted 4-0 Monocryl subcuticular stitches. The stab incision holes were closed with Dermabond and Dermabond was used to coat the rest of the closures. The patient tolerated procedure well. At the end of the case all needle sponge and instrument counts were correct. The patient was then awakened and taken to recovery in stable condition.  PLAN OF CARE: Admit to inpatient   PATIENT DISPOSITION:   PACU - hemodynamically stable.   Delay start of Pharmacological VTE agent (>24hrs) due to surgical blood loss or risk of bleeding: yes

## 2012-01-06 NOTE — Preoperative (Signed)
Beta Blockers   Reason not to administer Beta Blockers:Not Applicable 

## 2012-01-06 NOTE — Progress Notes (Signed)
CBG- 294, pt. OR bound.

## 2012-01-06 NOTE — OR Nursing (Signed)
Dr Imelda Pillow procedures: Flexible Cystoscopy. Dilation of Bladder Neck Stricture. Insertion of Foley Catheter.

## 2012-01-06 NOTE — Anesthesia Postprocedure Evaluation (Signed)
  Anesthesia Post-op Note  Patient: Eric [name]  Procedure(s) Performed: Procedure(s) (LRB): LAPAROSCOPIC VENTRAL HERNIA (N/A) INSERTION OF MESH (N/A) CYSTOSCOPY FLEXIBLE (N/A) CYSTOSCOPY WITH URETHRAL DILATATION (N/A)  Patient Location: PACU  Anesthesia Type: General  Level of Consciousness: awake  Airway and Oxygen Therapy: Patient Spontanous Breathing  Post-op Pain: mild  Post-op Assessment: Post-op Vital signs reviewed, Patient's Cardiovascular Status Stable, Respiratory Function Stable, Patent Airway, No signs of Nausea or vomiting and Pain level controlled  Post-op Vital Signs: stable  Complications: No apparent anesthesia complications

## 2012-01-06 NOTE — Consult Note (Signed)
Urology Consult  Referring physician: toth Reason for referral: acute urinary retention  Chief Complaint: acute urinary retention  History of Present Illness: 71 yo male RALP 2010 for G 3+3, PT2c, pN0, pMx CaP. PSA fell to 0.06 when last seen in 2010.  Pt now having hernia repair, with bladder neck contracture and inability to pass foley cath x 2 attempts.   Past Medical History  Diagnosis Date  . Hypertension   . Stress incontinence, male   . Malignant neoplasm prostate   . Impotence of organic origin   . Hearing problem   . History of blurred vision   . Ventral hernia   . Hyperlipidemia   . Anxiety   . Diabetes mellitus     insulin pump  . Shoulder injury     continues to have occ pain  . Nocturia   . Right shoulder pain     "due to prior surgery"  . Snoring disorder     "loud " per wife.   . Type I diabetes mellitus    Past Surgical History  Procedure Date  . Prostate surgery 02/2010  . Eye surgery 12& 07/2010    macular pucker    Medications: I have reviewed the patient's current medications. Allergies:  Allergies  Allergen Reactions  . Beef-Derived Products Swelling    NO BEEF,PORK & LAMB    Family History  Problem Relation Age of Onset  . Cancer    . Heart disease    . Diabetes    . Stroke Mother   . Heart disease Father    Social History:  reports that he has never smoked. He does not have any smokeless tobacco history on file. He reports that he does not drink alcohol or use illicit drugs.  ROS: All systems are reviewed and negative except as noted. Pt asleep.   Physical Exam:  Vital signs in last 24 hours: Temp:  [97.9 F (36.6 C)] 97.9 F (36.6 C) (08/13 0739) Pulse Rate:  [62] 62  (08/13 0739) Resp:  [18] 18  (08/13 0739) BP: (176)/(91) 176/91 mmHg (08/13 0739) SpO2:  [98 %] 98 % (08/13 0739)  Cardiovascular: Skin warm; not flushed Respiratory: Breaths quiet; no shortness of breath Abdomen: No masses Neurological: Normal sensation to  touch Musculoskeletal: Normal motor function arms and legs Lymphatics: No inguinal adenopathy Skin: No rashes Genitourinary:normal scrotum. Normal testes.  Normal penis.   Laboratory Data:  Results for orders placed during the hospital encounter of 01/06/12 (from the past 72 hour(s))  GLUCOSE, CAPILLARY     Status: Abnormal   Collection Time   01/06/12  7:43 AM      Component Value Range Comment   Glucose-Capillary 367 (*) 70 - 99 mg/dL   GLUCOSE, CAPILLARY     Status: Abnormal   Collection Time   01/06/12  8:41 AM      Component Value Range Comment   Glucose-Capillary 294 (*) 70 - 99 mg/dL    Comment 1 Notify RN     GLUCOSE, CAPILLARY     Status: Abnormal   Collection Time   01/06/12  9:11 AM      Component Value Range Comment   Glucose-Capillary 281 (*) 70 - 99 mg/dL    No results found for this or any previous visit (from the past 240 hour(s)). Creatinine: No results found for this basename: CREATININE:7 in the last 168 hours  Xrays: See report/chart   Impression/Assessment:  Probable BNC. Will need cysto and catheter.  Plan:  Flex cysto and cath.   Vlad Mayberry I 01/06/2012, 11:05 AM

## 2012-01-06 NOTE — Progress Notes (Signed)
Report received form Nathaneil Canary

## 2012-01-06 NOTE — Interval H&P Note (Signed)
History and Physical Interval Note:  01/06/2012 9:04 AM  Eric [name]  has presented today for surgery, with the diagnosis of Ventral Hernia  The various methods of treatment have been discussed with the patient and family. After consideration of risks, benefits and other options for treatment, the patient has consented to  Procedure(s) (LRB): LAPAROSCOPIC VENTRAL HERNIA (N/A) INSERTION OF MESH (N/A) as a surgical intervention .  The patient's history has been reviewed, patient examined, no change in status, stable for surgery.  I have reviewed the patient's chart and labs.  Questions were answered to the patient's satisfaction.     TOTH III,Marilyn Nihiser S

## 2012-01-06 NOTE — Anesthesia Preprocedure Evaluation (Signed)
Anesthesia Evaluation  Patient identified by MRN, date of birth, ID band Patient awake    Reviewed: Allergy & Precautions, H&P , NPO status , Patient's Chart, lab work & pertinent test results  History of Anesthesia Complications Negative for: history of anesthetic complications  Airway Mallampati: II  Neck ROM: Full    Dental  (+) Partial Lower and Partial Upper   Pulmonary neg pulmonary ROS,  breath sounds clear to auscultation        Cardiovascular hypertension, Rhythm:Regular Rate:Normal     Neuro/Psych PSYCHIATRIC DISORDERS Anxiety negative neurological ROS     GI/Hepatic negative GI ROS, Neg liver ROS,   Endo/Other    Renal/GU negative Renal ROS     Musculoskeletal   Abdominal (+) + obese,   Peds  Hematology   Anesthesia Other Findings   Reproductive/Obstetrics                           Anesthesia Physical Anesthesia Plan  ASA: III  Anesthesia Plan: General   Post-op Pain Management:    Induction: Intravenous  Airway Management Planned: Oral ETT  Additional Equipment:   Intra-op Plan:   Post-operative Plan:   Informed Consent: I have reviewed the patients History and Physical, chart, labs and discussed the procedure including the risks, benefits and alternatives for the proposed anesthesia with the patient or authorized representative who has indicated his/her understanding and acceptance.   Dental advisory given  Plan Discussed with: CRNA and Surgeon  Anesthesia Plan Comments:         Anesthesia Quick Evaluation

## 2012-01-06 NOTE — Transfer of Care (Signed)
Immediate Anesthesia Transfer of Care Note  Patient: Eric [name]  Procedure(s) Performed: Procedure(s) (LRB): LAPAROSCOPIC VENTRAL HERNIA (N/A) INSERTION OF MESH (N/A) CYSTOSCOPY FLEXIBLE (N/A) CYSTOSCOPY WITH URETHRAL DILATATION (N/A)  Patient Location: PACU  Anesthesia Type: General  Level of Consciousness: awake and alert   Airway & Oxygen Therapy: Patient Spontanous Breathing and Patient connected to face mask oxygen  Post-op Assessment: Report given to PACU RN  Post vital signs: Reviewed and stable  Complications: No apparent anesthesia complications

## 2012-01-06 NOTE — Progress Notes (Signed)
Pt. Reports that Dr. Carolynne Edouard instructed him to d/c insulin pump a.m. Of surgery. Pt. D/c'd pump at 0730, pump & site out as of 0730. CBG- 367 at 0740.  Pt. Denies S&S of Hyperglycemia. Call to Dr. Krista Blue, reported findings. Order rec'd to ask pt. the amount of insulin bolus he would give for a blood sugar of this level (367).  Pt. Reports he gives 5 units for every 100 points he wants to reduce the Bld. Sugar.

## 2012-01-06 NOTE — Progress Notes (Signed)
Call placed by R. Todd,RN to Diabetic Nurse team, reported pt. Id & use of insulin pump & proposed hospitalization of 2-3 days per pt.

## 2012-01-07 ENCOUNTER — Encounter (HOSPITAL_COMMUNITY): Payer: Self-pay | Admitting: Emergency Medicine

## 2012-01-07 DIAGNOSIS — N320 Bladder-neck obstruction: Secondary | ICD-10-CM | POA: Diagnosis not present

## 2012-01-07 DIAGNOSIS — R109 Unspecified abdominal pain: Secondary | ICD-10-CM | POA: Diagnosis not present

## 2012-01-07 DIAGNOSIS — R339 Retention of urine, unspecified: Secondary | ICD-10-CM | POA: Diagnosis not present

## 2012-01-07 DIAGNOSIS — E119 Type 2 diabetes mellitus without complications: Secondary | ICD-10-CM

## 2012-01-07 DIAGNOSIS — K439 Ventral hernia without obstruction or gangrene: Secondary | ICD-10-CM | POA: Diagnosis not present

## 2012-01-07 LAB — CBC
MCH: 29 pg (ref 26.0–34.0)
MCV: 86.4 fL (ref 78.0–100.0)
Platelets: 137 10*3/uL — ABNORMAL LOW (ref 150–400)
RDW: 14.6 % (ref 11.5–15.5)

## 2012-01-07 LAB — GLUCOSE, CAPILLARY
Glucose-Capillary: 173 mg/dL — ABNORMAL HIGH (ref 70–99)
Glucose-Capillary: 275 mg/dL — ABNORMAL HIGH (ref 70–99)
Glucose-Capillary: 97 mg/dL (ref 70–99)

## 2012-01-07 MED ORDER — MORPHINE SULFATE (PF) 1 MG/ML IV SOLN
INTRAVENOUS | Status: DC
  Administered 2012-01-07: 13:00:00 via INTRAVENOUS
  Administered 2012-01-07: 10.5 mg via INTRAVENOUS
  Filled 2012-01-07: qty 25

## 2012-01-07 MED ORDER — ONDANSETRON HCL 4 MG/2ML IJ SOLN: 4.0000 mg | Freq: Four times a day (QID) | INTRAMUSCULAR | Status: DC | PRN

## 2012-01-07 MED ORDER — POTASSIUM CHLORIDE IN NACL 20-0.9 MEQ/L-% IV SOLN
INTRAVENOUS | Status: DC
  Administered 2012-01-07: 75 mL/h via INTRAVENOUS
  Administered 2012-01-08 – 2012-01-09 (×3): via INTRAVENOUS
  Filled 2012-01-07 (×6): qty 1000

## 2012-01-07 MED ORDER — INSULIN ASPART 100 UNIT/ML ~~LOC~~ SOLN
0.0000 [IU] | Freq: Three times a day (TID) | SUBCUTANEOUS | Status: DC
  Administered 2012-01-09: 6.4 [IU] via SUBCUTANEOUS

## 2012-01-07 MED ORDER — NALOXONE HCL 0.4 MG/ML IJ SOLN: 0.4000 mg | INTRAMUSCULAR | Status: DC | PRN

## 2012-01-07 MED ORDER — SODIUM CHLORIDE 0.9 % IJ SOLN: 9.0000 mL | INTRAMUSCULAR | Status: DC | PRN

## 2012-01-07 MED ORDER — DIPHENHYDRAMINE HCL 12.5 MG/5ML PO ELIX
12.5000 mg | ORAL_SOLUTION | Freq: Four times a day (QID) | ORAL | Status: DC | PRN
  Filled 2012-01-07: qty 5

## 2012-01-07 MED ORDER — DIPHENHYDRAMINE HCL 50 MG/ML IJ SOLN: 12.5000 mg | Freq: Four times a day (QID) | INTRAMUSCULAR | Status: DC | PRN

## 2012-01-07 NOTE — Progress Notes (Addendum)
Patient admitted for ventral hernia repair.  Has significant history of diabetes.  Was diagnosed with diabetes when he was 71 years old.  Has always taken insulin and began insulin pump therapy about 9 years ago.  Sees Dr. Adrian Prince for endocrinology.  Patient is completely insulin dependent and requires both basal and bolus insulin therapy at all times.    Patient stated to me that his surgeon, Dr. Carolynne Edouard, instructed him to remove his insulin pump for surgery.  Insulin pump was removed by patient at ~7am yesterday morning and the insulin pump was not restarted until ~7pm last night per patient. Patient resumed insulin pump on his left upper thigh last evening with new insulin pump supplies and new insulin.  Per records, patient was given a one time dose of Novolog 7 units SQ yesterday morning at 0819am.  CBG yesterday at 1305pm was 141 mg/dl.  Did not receive any more insulin until yesterday at 1726pm- Got 15 units Novolog for CBG of 377 mg/dl.  No current BMETs on file.  CBGs uncontrolled so far today.  Noted Dr. Carolynne Edouard increased patient's ordered SSi to Resistant scale, however, patient not taking any SQ SSi b/c he uses his insulin pump to control his CBGs.  Resistant SSi needs to be discontinued since patient is on his insulin pump.  Results for Eric, [name] (MRN [id number]) as of 01/07/2012 13:42  Ref. Range 01/07/2012 07:51 01/07/2012 12:22  Glucose-Capillary Latest Range: 70-99 mg/dL 409 (H) 811 (H)    Patient checked his CBG with his own meter at ~1pm.  CBG on his home meter was 354 mg/dl.  Patient stated he was concerned b/c he had given himself 5 units Novolog about 30 minutes prior to this CBG he took.  Upon inspection of the insertion site, everything appeared WNL.  Site intact, no insulin found to be leaking at site.  All pump connections secure.  Have asked RN to please check a repeat CBG at 2pm today.  If CBG still significantly elevated, will call patient's surgeon with concerns.  Basal  rates on pump are as follows: 12am-       0.8 units/hr 4:30am-    2.1 units/hr 7am-         1.3 units/hr 11:30am-  0.8 units/hr 2pm-         1.1 units/hr 9:30pm-    0.9 units/hr  Patient gets a total of 27.2 units total basal insulin per pump per 24 hour period.  Carbohydrate ratio is 1 unit insulin for every 7 grams carbs consumed  Correction factor is 1 unit insulin for every 25 mg/dl above his target CBG of 110 mg/dl  [id number] Addendum:  Patient's CBG on hospital meter 275 mg/dl ~9:56 pm.  Patient rechecked his CBG with his own CBG meter again and got 296 mg/dl around 3pm.  Patient stated he was going to give himself 8 units Novolog to correct his CBG.    Will follow closely. Eric Finland RN, MSN, CDE Diabetes Coordinator Inpatient Diabetes Program [phone]

## 2012-01-07 NOTE — Progress Notes (Signed)
1 Day Post-Op  Subjective: Complains of pain. Blood sugars have been high but improving  Objective: Vital signs in last 24 hours: Temp:  [97.4 F (36.3 C)-98.5 F (36.9 C)] 98.4 F (36.9 C) (08/14 0619) Pulse Rate:  [70-86] 73  (08/14 0619) Resp:  [13-20] 20  (08/14 0619) BP: (112-148)/(41-69) 148/61 mmHg (08/14 0619) SpO2:  [92 %-100 %] 92 % (08/14 0619) Weight:  [184 lb (83.462 kg)] 184 lb (83.462 kg) (08/13 1625) Last BM Date: 01/05/12  Intake/Output from previous day: 08/13 0701 - 08/14 0700 In: 2647.5 [P.O.:240; I.V.:2157.5] Out: 1700 [Urine:1675; Blood:25] Intake/Output this shift:    GI: soft, appropriately tender. good bs. incisions look good  Lab Results:   Basename 01/07/12 0535  WBC 7.0  HGB 12.8*  HCT 38.2*  PLT 137*   BMET No results found for this basename: NA:2,K:2,CL:2,CO2:2,GLUCOSE:2,BUN:2,CREATININE:2,CALCIUM:2 in the last 72 hours PT/INR No results found for this basename: LABPROT:2,INR:2 in the last 72 hours ABG No results found for this basename: PHART:2,PCO2:2,PO2:2,HCO3:2 in the last 72 hours  Studies/Results: No results found.  Anti-infectives: Anti-infectives     Start     Dose/Rate Route Frequency Ordered Stop   12/30/11 1446   ceFAZolin (ANCEF) IVPB 2 g/50 mL premix        2 g 100 mL/hr over 30 Minutes Intravenous 60 min pre-op 12/30/11 1446 01/06/12 1011          Assessment/Plan: s/p Procedure(s) (LRB): LAPAROSCOPIC VENTRAL HERNIA (N/A) INSERTION OF MESH (N/A) CYSTOSCOPY FLEXIBLE (N/A) CYSTOSCOPY WITH URETHRAL DILATATION (N/A) Advance diet Back on insulin pump. Will increase sliding scale Ambulate Start PCA for pain control  LOS: 1 day    TOTH III,PAUL S 01/07/2012

## 2012-01-07 NOTE — Progress Notes (Signed)
1 Day Post-Op Subjective: Patient is POD 1 s/p ventral hernia repair by Dr. Carolynne Edouard and cysto/urethral dilation with foley placement by Dr. Patsi Sears.  He states he is having abd pain but otherwise feels ok.    Hx:  71 yo male RALP 2010 for G 3+3, PT2c, pN0, pMx CaP. PSA fell to 0.06 when last seen in 2010. Pt now having hernia repair, with bladder neck contracture and inability to pass foley cath x 2 attempts.       Objective: Vital signs in last 24 hours: Temp:  [97.4 F (36.3 C)-98.5 F (36.9 C)] 98.4 F (36.9 C) (08/14 0619) Pulse Rate:  [70-86] 73  (08/14 0619) Resp:  [13-20] 20  (08/14 0619) BP: (112-148)/(41-69) 148/61 mmHg (08/14 0619) SpO2:  [92 %-100 %] 92 % (08/14 0619) Weight:  [83.462 kg (184 lb)] 83.462 kg (184 lb) (08/13 1625)  Intake/Output from previous day: 08/13 0701 - 08/14 0700 In: 2647.5 [P.O.:240; I.V.:2157.5] Out: 1700 [Urine:1675; Blood:25] Intake/Output this shift:    Physical Exam:  General:alert, cooperative and no distress GI: soft Foley in place with yellow urine   Lab Results:  Basename 01/07/12 0535  HGB 12.8*  HCT 38.2*   BMET No results found for this basename: NA:2,K:2,CL:2,CO2:2,GLUCOSE:2,BUN:2,CREATININE:2,CALCIUM:2 in the last 72 hours No results found for this basename: LABPT:3,INR:3 in the last 72 hours No results found for this basename: LABURIN:1 in the last 72 hours Results for orders placed during the hospital encounter of 12/25/11  SURGICAL PCR SCREEN     Status: Normal   Collection Time   12/25/11  9:03 AM      Component Value Range Status Comment   MRSA, PCR NEGATIVE  NEGATIVE Final    Staphylococcus aureus NEGATIVE  NEGATIVE Final     Studies/Results: No results found.  Assessment/Plan: 1 Day Post-Op Procedure(s) (LRB): LAPAROSCOPIC VENTRAL HERNIA (N/A) INSERTION OF MESH (N/A) CYSTOSCOPY FLEXIBLE (N/A) CYSTOSCOPY WITH URETHRAL DILATATION (N/A)  Pt doing well from uro standpoint.  Will need to leave foley in place  and f/u with Dr. Patsi Sears for void trial approx 1 week after d/c home.    LOS: 1 day   YARBROUGH,Amour Trigg G. 01/07/2012, 11:08 AM

## 2012-01-08 ENCOUNTER — Encounter (HOSPITAL_COMMUNITY): Payer: Self-pay | Admitting: General Surgery

## 2012-01-08 LAB — GLUCOSE, CAPILLARY
Glucose-Capillary: 243 mg/dL — ABNORMAL HIGH (ref 70–99)
Glucose-Capillary: 403 mg/dL — ABNORMAL HIGH (ref 70–99)
Glucose-Capillary: 278 mg/dL — ABNORMAL HIGH (ref 70–99)

## 2012-01-08 MED ORDER — PANTOPRAZOLE SODIUM 40 MG PO TBEC
40.0000 mg | DELAYED_RELEASE_TABLET | Freq: Every day | ORAL | Status: DC
  Administered 2012-01-08: 40 mg via ORAL

## 2012-01-08 MED ORDER — ALUM & MAG HYDROXIDE-SIMETH 200-200-20 MG/5ML PO SUSP
30.0000 mL | Freq: Four times a day (QID) | ORAL | Status: DC | PRN
  Administered 2012-01-08: 30 mL via ORAL
  Filled 2012-01-08: qty 30

## 2012-01-08 NOTE — Plan of Care (Signed)
Problem: Phase I Progression Outcomes Goal: Voiding-avoid urinary catheter unless indicated Outcome: Not Met (add Reason) Specific urinary problems  Problem: Phase II Progression Outcomes Goal: Foley discontinued Outcome: Not Met (add Reason) Order to leave catheter secondary to urinary problems

## 2012-01-08 NOTE — Progress Notes (Signed)
2 Days Post-Op  Subjective: Pain much improved. Still having trouble regulating blood sugar  Objective: Vital signs in last 24 hours: Temp:  [97.9 F (36.6 C)-98.7 F (37.1 C)] 98.7 F (37.1 C) (08/15 0611) Pulse Rate:  [75-80] 80  (08/15 0611) Resp:  [10-20] 18  (08/15 0611) BP: (155-169)/(71-88) 166/74 mmHg (08/15 0611) SpO2:  [93 %-98 %] 97 % (08/15 0611) Last BM Date: 01/05/12  Intake/Output from previous day: 08/14 0701 - 08/15 0700 In: 2295 [P.O.:240; I.V.:2055] Out: 3300 [Urine:3300] Intake/Output this shift: Total I/O In: 360 [P.O.:360] Out: -   GI: soft, less tender. good bs. incisions ok  Lab Results:   Basename 01/07/12 0535  WBC 7.0  HGB 12.8*  HCT 38.2*  PLT 137*   BMET No results found for this basename: NA:2,K:2,CL:2,CO2:2,GLUCOSE:2,BUN:2,CREATININE:2,CALCIUM:2 in the last 72 hours PT/INR No results found for this basename: LABPROT:2,INR:2 in the last 72 hours ABG No results found for this basename: PHART:2,PCO2:2,PO2:2,HCO3:2 in the last 72 hours  Studies/Results: No results found.  Anti-infectives: Anti-infectives     Start     Dose/Rate Route Frequency Ordered Stop   12/30/11 1446   ceFAZolin (ANCEF) IVPB 2 g/50 mL premix        2 g 100 mL/hr over 30 Minutes Intravenous 60 min pre-op 12/30/11 1446 01/06/12 1011          Assessment/Plan: s/p Procedure(s) (LRB): LAPAROSCOPIC VENTRAL HERNIA (N/A) INSERTION OF MESH (N/A) CYSTOSCOPY FLEXIBLE (N/A) CYSTOSCOPY WITH URETHRAL DILATATION (N/A) Ambulate. Continue insulin pump and ssi for diabetes Leave foley in until outpt follow up with urology  LOS: 2 days    TOTH III,Luana Tatro S 01/08/2012

## 2012-01-08 NOTE — Progress Notes (Addendum)
Results for CARNELIUS, HAMMITT (MRN [id number]) as of 01/08/2012 16:15  Ref. Range 01/07/2012 07:51 01/07/2012 12:22 01/07/2012 14:31 01/07/2012 17:02 01/07/2012 22:11  Glucose-Capillary Latest Range: 70-99 mg/dL 409 (H) 811 (H) 914 (H) 208 (H) 97   Results for RIP, HAWES (MRN [id number]) as of 01/08/2012 16:15  Ref. Range 01/08/2012 00:02 01/08/2012 04:02 01/08/2012 06:16 01/08/2012 07:56 01/08/2012 12:13  Glucose-Capillary Latest Range: 70-99 mg/dL 086 (H) 578 (H) 469 (H) 323 (H) 403 (H)   Uncontrolled CBGs in hospital.  Went in to speak with patient and patient's wife.  Patient's wife told me patient's CBG last evening of 97 mg/dl was too low for him and that the patient wanted to keep his CBGs closer to 200 mg/dl while here in hospital and while on pain medication.  Patient told me he was nervous about going low last night, and as a result, he ate ~60 grams of carbs after bedtime to get his CBG back up.  As a result, CBGs have steadily climbed today.  I suspect patient is intentionally under dosing his insulin with his pump b/c of his fear of having a low CBG while here in the hospital.  I asked patient's wife if she wanted me to call patient's endocrinologist Dr. Adrian Prince for insulin pump adjustments.  Patient and wife told me that they would rather me not contact Dr. Evlyn Kanner.    Patient checked his CBG with his meter at 12:15pm and it was 386 mg/dl.  Patient gave himself 11 units Novolog [facility] meter at this same time read 403 mg/dl).  Patient then checked his CBG again at 1415 pm and it was 372 mg/dl.  Patient stated he gave himself an additional 10.4 units Novolog.  Patient stated he probably would not eat dinner tonight.  I urged patient to correct his CBG later today even if he does not eat.  Patient told me he is having symptoms of high blood sugars (dry mouth, thirst, increased urination).  Reminded patient that good CBG control is important for healing!  Patient and wife anxious to go home  tomorrow.  I encouraged them to call Dr. Rinaldo Cloud office right away if patient continues to have serious CBG elevations at home after d/c.  Patient's wife said patient has a follow-up visit with Dr. Evlyn Kanner on [place] 1st, but they will call his office if his CBGs stay high after d/c.  Will follow. Ambrose Finland RN, MSN, CDE Diabetes Coordinator Inpatient Diabetes Program [phone]

## 2012-01-09 LAB — GLUCOSE, CAPILLARY: Glucose-Capillary: 205 mg/dL — ABNORMAL HIGH (ref 70–99)

## 2012-01-09 MED ORDER — HYDROCODONE-ACETAMINOPHEN 5-325 MG PO TABS: 1.0000 | ORAL_TABLET | ORAL | Status: AC | PRN

## 2012-01-09 NOTE — Progress Notes (Signed)
3 Days Post-Op  Subjective: Feels better. No complaints today  Objective: Vital signs in last 24 hours: Temp:  [98 F (36.7 C)-98.6 F (37 C)] 98 F (36.7 C) (08/16 0537) Pulse Rate:  [62-88] 62  (08/16 0537) Resp:  [16-18] 16  (08/16 0537) BP: (148-163)/(65-73) 148/65 mmHg (08/16 0537) SpO2:  [94 %-100 %] 94 % (08/16 0537) Last BM Date: 01/05/12  Intake/Output from previous day: 08/15 0701 - 08/16 0700 In: 1368.8 [P.O.:600; I.V.:768.8] Out: 2875 [Urine:2875] Intake/Output this shift:    GI: soft, less tender. incisions ok  Lab Results:   Basename 01/07/12 0535  WBC 7.0  HGB 12.8*  HCT 38.2*  PLT 137*   BMET No results found for this basename: NA:2,K:2,CL:2,CO2:2,GLUCOSE:2,BUN:2,CREATININE:2,CALCIUM:2 in the last 72 hours PT/INR No results found for this basename: LABPROT:2,INR:2 in the last 72 hours ABG No results found for this basename: PHART:2,PCO2:2,PO2:2,HCO3:2 in the last 72 hours  Studies/Results: No results found.  Anti-infectives: Anti-infectives     Start     Dose/Rate Route Frequency Ordered Stop   12/30/11 1446   ceFAZolin (ANCEF) IVPB 2 g/50 mL premix        2 g 100 mL/hr over 30 Minutes Intravenous 60 min pre-op 12/30/11 1446 01/06/12 1011          Assessment/Plan: s/p Procedure(s) (LRB): LAPAROSCOPIC VENTRAL HERNIA (N/A) INSERTION OF MESH (N/A) CYSTOSCOPY FLEXIBLE (N/A) CYSTOSCOPY WITH URETHRAL DILATATION (N/A) Plan for discharge today Will need to follow up with urology next week to have foley removed  LOS: 3 days    TOTH III,PAUL S 01/09/2012

## 2012-01-09 NOTE — Discharge Summary (Signed)
Physician Discharge Summary  Patient ID: Eric [name] MRN: [id number] DOB/AGE: [birth year] 71 y.o.  Admit date: 01/06/2012 Discharge date: 01/09/2012  Admission Diagnoses:  Discharge Diagnoses:  Active Problems:  * No active hospital problems. *    Discharged Condition: good  Hospital Course: the pt underwent lap vhr with mesh. Postop he had some issues with pain control and high blood sugar. He was restarted on his insulin pump and covered with ssi with improvement. His pain gradually improved and on pod 3 he was ready for discharge.  Consults: None  Significant Diagnostic Studies: none  Treatments: surgery: lap vhr with mesh  Discharge Exam: Blood pressure 148/65, pulse 62, temperature 98 F (36.7 C), temperature source Oral, resp. rate 16, height 5\' 7"  (1.702 m), weight 184 lb (83.462 kg), SpO2 94.00%. GI: soft, less tender. incisions ok  Disposition:   Discharge Orders    Future Appointments: Provider: Department: Dept Phone: Center:   02/09/2012 10:40 AM Caleen Essex III, MD [facility] [phone] None     Future Orders Please Complete By Expires   Diet - low sodium heart healthy      Increase activity slowly      Discharge instructions      Comments:   No heavy lifting. Diet diabetic. Follow blood sugar levels. Wear abd binder most of the time   No wound care      Call MD for:  temperature >100.4      Call MD for:  persistant nausea and vomiting      Call MD for:  severe uncontrolled pain      Call MD for:  redness, tenderness, or signs of infection (pain, swelling, redness, odor or green/yellow discharge around incision site)      Call MD for:  difficulty breathing, headache or visual disturbances      Call MD for:  hives      Call MD for:  persistant dizziness or light-headedness      Call MD for:  extreme fatigue        Medication List  As of 01/09/2012  7:35 AM   TAKE these medications         amLODipine 5 MG tablet   Commonly known as: NORVASC   Take 5 mg by mouth daily.      aspirin 81 MG tablet   Take 81 mg by mouth daily.      citalopram 20 MG tablet   Commonly known as: CELEXA   Take 20 mg by mouth daily.      HYDROcodone-acetaminophen 5-325 MG per tablet   Commonly known as: NORCO/VICODIN   Take 1-2 tablets by mouth every 4 (four) hours as needed for pain.      insulin aspart 100 UNIT/ML injection   Commonly known as: novoLOG   Inject into the skin continuous.      omega-3 acid ethyl esters 1 G capsule   Commonly known as: LOVAZA   Take 2 g by mouth 2 (two) times daily.      rosuvastatin 20 MG tablet   Commonly known as: CRESTOR   Take 20 mg by mouth every other day.           Follow-up Information    Follow up with TANNENBAUM, SIGMUND I, MD in 1 week. (call [phone] for appt 1 week after discharge home)    Contact information:   [address] [place] [facility] [place] [address] [phone]  Follow up with TOTH III,Tambria Pfannenstiel S, MD in 2 weeks.   Contact information:   [address] [place] [address] [phone]          Signed: Robyne Askew 01/09/2012, 7:35 AM

## 2012-01-09 NOTE — Discharge Planning (Signed)
Patient discharged home in stable condition. Verbalizes understanding of all discharge instructions, including home medications and follow up appointments. 

## 2012-01-15 ENCOUNTER — Encounter (INDEPENDENT_AMBULATORY_CARE_PROVIDER_SITE_OTHER): Payer: Medicare Other | Admitting: General Surgery

## 2012-01-16 DIAGNOSIS — N320 Bladder-neck obstruction: Secondary | ICD-10-CM | POA: Diagnosis not present

## 2012-01-19 DIAGNOSIS — N320 Bladder-neck obstruction: Secondary | ICD-10-CM | POA: Diagnosis not present

## 2012-01-19 DIAGNOSIS — R82998 Other abnormal findings in urine: Secondary | ICD-10-CM | POA: Diagnosis not present

## 2012-01-23 ENCOUNTER — Ambulatory Visit (INDEPENDENT_AMBULATORY_CARE_PROVIDER_SITE_OTHER): Payer: Medicare Other | Admitting: General Surgery

## 2012-01-23 ENCOUNTER — Encounter (INDEPENDENT_AMBULATORY_CARE_PROVIDER_SITE_OTHER): Payer: Self-pay | Admitting: General Surgery

## 2012-01-23 VITALS — BP 140/76 | HR 64 | Temp 97.4°F | Resp 16 | Ht 67.0 in | Wt 178.2 lb

## 2012-01-23 DIAGNOSIS — K439 Ventral hernia without obstruction or gangrene: Secondary | ICD-10-CM

## 2012-01-23 NOTE — Patient Instructions (Signed)
No heavy lifting for another 3 weeks 

## 2012-01-23 NOTE — Progress Notes (Signed)
Subjective:     Patient ID: Eric [name], male   DOB: [birth year], 71 y.o.   MRN: [id number]  HPI The patient is a 71 year old white male who is about 3 weeks out from a laparoscopic ventral hernia repair with mesh. He notes some mild soreness but otherwise seems to be doing very well. His appetite is good and his bowels are working normally.  Review of Systems     Objective:   Physical Exam On exam his abdomen is soft and nontender. His incisions are healing nicely with no sign of infection. His abdominal wall feels very solid with no palpable evidence for recurrence of the hernia.    Assessment:     3 weeks status post laparoscopic ventral hernia repair with mesh    Plan:     At this point I would like him to refrain from any strenuous activity for another 3 weeks. We will plan to see him back in about a month to check his progress.

## 2012-01-27 DIAGNOSIS — I798 Other disorders of arteries, arterioles and capillaries in diseases classified elsewhere: Secondary | ICD-10-CM | POA: Diagnosis not present

## 2012-01-27 DIAGNOSIS — Z23 Encounter for immunization: Secondary | ICD-10-CM | POA: Diagnosis not present

## 2012-01-27 DIAGNOSIS — T782XXA Anaphylactic shock, unspecified, initial encounter: Secondary | ICD-10-CM | POA: Diagnosis not present

## 2012-01-27 DIAGNOSIS — G4730 Sleep apnea, unspecified: Secondary | ICD-10-CM | POA: Diagnosis not present

## 2012-02-03 DIAGNOSIS — I658 Occlusion and stenosis of other precerebral arteries: Secondary | ICD-10-CM | POA: Diagnosis not present

## 2012-02-03 DIAGNOSIS — I6529 Occlusion and stenosis of unspecified carotid artery: Secondary | ICD-10-CM | POA: Diagnosis not present

## 2012-02-09 ENCOUNTER — Encounter (INDEPENDENT_AMBULATORY_CARE_PROVIDER_SITE_OTHER): Payer: Medicare Other | Admitting: General Surgery

## 2012-02-17 DIAGNOSIS — H3581 Retinal edema: Secondary | ICD-10-CM | POA: Diagnosis not present

## 2012-02-17 DIAGNOSIS — E11339 Type 2 diabetes mellitus with moderate nonproliferative diabetic retinopathy without macular edema: Secondary | ICD-10-CM | POA: Diagnosis not present

## 2012-02-17 DIAGNOSIS — E11329 Type 2 diabetes mellitus with mild nonproliferative diabetic retinopathy without macular edema: Secondary | ICD-10-CM | POA: Diagnosis not present

## 2012-02-17 DIAGNOSIS — E1039 Type 1 diabetes mellitus with other diabetic ophthalmic complication: Secondary | ICD-10-CM | POA: Diagnosis not present

## 2012-02-24 ENCOUNTER — Encounter (INDEPENDENT_AMBULATORY_CARE_PROVIDER_SITE_OTHER): Payer: Self-pay | Admitting: General Surgery

## 2012-02-24 ENCOUNTER — Ambulatory Visit (INDEPENDENT_AMBULATORY_CARE_PROVIDER_SITE_OTHER): Payer: Medicare Other | Admitting: General Surgery

## 2012-02-24 VITALS — BP 134/72 | HR 57 | Temp 98.0°F | Ht 67.0 in | Wt 181.8 lb

## 2012-02-24 DIAGNOSIS — K439 Ventral hernia without obstruction or gangrene: Secondary | ICD-10-CM

## 2012-02-24 NOTE — Progress Notes (Signed)
Subjective:     Patient ID: Eric [name], male   DOB: [birth year], 71 y.o.   MRN: [id number]  HPI The patient is a 71 year old white male who is now just 6 weeks out from a laparoscopic ventral hernia repair with mesh. He is doing well. He has no complaints today. His appetite is good and his bowels are working normally. He denies any abdominal pain.  Review of Systems     Objective:   Physical Exam On exam his abdomen is soft and nontender. His incisions have healed nicely with no sign of infection. His abdominal wall feels very solid with no palpable evidence of recurrence of the hernia    Assessment:     6 weeks status post laparoscopic ventral hernia repair with mesh    Plan:     At this point I believe he can return all of his normal activities without any restrictions. We will plan to see him back on a when necessary basis

## 2012-02-24 NOTE — Patient Instructions (Signed)
May return to all normal acitivities 

## 2012-03-26 DIAGNOSIS — I6529 Occlusion and stenosis of unspecified carotid artery: Secondary | ICD-10-CM | POA: Diagnosis not present

## 2012-03-26 DIAGNOSIS — G4730 Sleep apnea, unspecified: Secondary | ICD-10-CM | POA: Diagnosis not present

## 2012-03-26 DIAGNOSIS — I798 Other disorders of arteries, arterioles and capillaries in diseases classified elsewhere: Secondary | ICD-10-CM | POA: Diagnosis not present

## 2012-03-26 DIAGNOSIS — E1029 Type 1 diabetes mellitus with other diabetic kidney complication: Secondary | ICD-10-CM | POA: Diagnosis not present

## 2012-03-31 DIAGNOSIS — E11339 Type 2 diabetes mellitus with moderate nonproliferative diabetic retinopathy without macular edema: Secondary | ICD-10-CM | POA: Diagnosis not present

## 2012-03-31 DIAGNOSIS — E1039 Type 1 diabetes mellitus with other diabetic ophthalmic complication: Secondary | ICD-10-CM | POA: Diagnosis not present

## 2012-03-31 DIAGNOSIS — E11329 Type 2 diabetes mellitus with mild nonproliferative diabetic retinopathy without macular edema: Secondary | ICD-10-CM | POA: Diagnosis not present

## 2012-03-31 DIAGNOSIS — E11311 Type 2 diabetes mellitus with unspecified diabetic retinopathy with macular edema: Secondary | ICD-10-CM | POA: Diagnosis not present

## 2012-05-10 DIAGNOSIS — N35919 Unspecified urethral stricture, male, unspecified site: Secondary | ICD-10-CM | POA: Diagnosis not present

## 2012-05-10 DIAGNOSIS — N393 Stress incontinence (female) (male): Secondary | ICD-10-CM | POA: Diagnosis not present

## 2012-05-10 DIAGNOSIS — C61 Malignant neoplasm of prostate: Secondary | ICD-10-CM | POA: Diagnosis not present

## 2012-05-30 DIAGNOSIS — S139XXA Sprain of joints and ligaments of unspecified parts of neck, initial encounter: Secondary | ICD-10-CM | POA: Diagnosis not present

## 2012-06-30 DIAGNOSIS — H3581 Retinal edema: Secondary | ICD-10-CM | POA: Diagnosis not present

## 2012-06-30 DIAGNOSIS — E11339 Type 2 diabetes mellitus with moderate nonproliferative diabetic retinopathy without macular edema: Secondary | ICD-10-CM | POA: Diagnosis not present

## 2012-09-01 DIAGNOSIS — E1029 Type 1 diabetes mellitus with other diabetic kidney complication: Secondary | ICD-10-CM | POA: Diagnosis not present

## 2012-09-01 DIAGNOSIS — E785 Hyperlipidemia, unspecified: Secondary | ICD-10-CM | POA: Diagnosis not present

## 2012-09-01 DIAGNOSIS — G4730 Sleep apnea, unspecified: Secondary | ICD-10-CM | POA: Diagnosis not present

## 2012-09-01 DIAGNOSIS — C61 Malignant neoplasm of prostate: Secondary | ICD-10-CM | POA: Diagnosis not present

## 2012-09-01 DIAGNOSIS — I798 Other disorders of arteries, arterioles and capillaries in diseases classified elsewhere: Secondary | ICD-10-CM | POA: Diagnosis not present

## 2012-09-01 DIAGNOSIS — I6529 Occlusion and stenosis of unspecified carotid artery: Secondary | ICD-10-CM | POA: Diagnosis not present

## 2012-09-01 DIAGNOSIS — N058 Unspecified nephritic syndrome with other morphologic changes: Secondary | ICD-10-CM | POA: Diagnosis not present

## 2012-09-01 DIAGNOSIS — I10 Essential (primary) hypertension: Secondary | ICD-10-CM | POA: Diagnosis not present

## 2012-10-22 DIAGNOSIS — L570 Actinic keratosis: Secondary | ICD-10-CM | POA: Diagnosis not present

## 2012-10-22 DIAGNOSIS — B372 Candidiasis of skin and nail: Secondary | ICD-10-CM | POA: Diagnosis not present

## 2012-10-22 DIAGNOSIS — K130 Diseases of lips: Secondary | ICD-10-CM | POA: Diagnosis not present

## 2012-10-27 DIAGNOSIS — E11339 Type 2 diabetes mellitus with moderate nonproliferative diabetic retinopathy without macular edema: Secondary | ICD-10-CM | POA: Diagnosis not present

## 2012-10-27 DIAGNOSIS — H3581 Retinal edema: Secondary | ICD-10-CM | POA: Diagnosis not present

## 2012-10-27 DIAGNOSIS — E11329 Type 2 diabetes mellitus with mild nonproliferative diabetic retinopathy without macular edema: Secondary | ICD-10-CM | POA: Diagnosis not present

## 2012-10-27 DIAGNOSIS — E1039 Type 1 diabetes mellitus with other diabetic ophthalmic complication: Secondary | ICD-10-CM | POA: Diagnosis not present

## 2012-11-15 DIAGNOSIS — C61 Malignant neoplasm of prostate: Secondary | ICD-10-CM | POA: Diagnosis not present

## 2012-11-15 DIAGNOSIS — N529 Male erectile dysfunction, unspecified: Secondary | ICD-10-CM | POA: Diagnosis not present

## 2012-11-15 DIAGNOSIS — N393 Stress incontinence (female) (male): Secondary | ICD-10-CM | POA: Diagnosis not present

## 2013-01-12 DIAGNOSIS — C61 Malignant neoplasm of prostate: Secondary | ICD-10-CM | POA: Diagnosis not present

## 2013-01-12 DIAGNOSIS — E1029 Type 1 diabetes mellitus with other diabetic kidney complication: Secondary | ICD-10-CM | POA: Diagnosis not present

## 2013-01-12 DIAGNOSIS — E11319 Type 2 diabetes mellitus with unspecified diabetic retinopathy without macular edema: Secondary | ICD-10-CM | POA: Diagnosis not present

## 2013-01-12 DIAGNOSIS — I10 Essential (primary) hypertension: Secondary | ICD-10-CM | POA: Diagnosis not present

## 2013-01-12 DIAGNOSIS — D126 Benign neoplasm of colon, unspecified: Secondary | ICD-10-CM | POA: Diagnosis not present

## 2013-01-12 DIAGNOSIS — I798 Other disorders of arteries, arterioles and capillaries in diseases classified elsewhere: Secondary | ICD-10-CM | POA: Diagnosis not present

## 2013-01-12 DIAGNOSIS — E785 Hyperlipidemia, unspecified: Secondary | ICD-10-CM | POA: Diagnosis not present

## 2013-01-12 DIAGNOSIS — N058 Unspecified nephritic syndrome with other morphologic changes: Secondary | ICD-10-CM | POA: Diagnosis not present

## 2013-02-03 DIAGNOSIS — Z4681 Encounter for fitting and adjustment of insulin pump: Secondary | ICD-10-CM | POA: Diagnosis not present

## 2013-02-03 DIAGNOSIS — E1029 Type 1 diabetes mellitus with other diabetic kidney complication: Secondary | ICD-10-CM | POA: Diagnosis not present

## 2013-02-03 DIAGNOSIS — I10 Essential (primary) hypertension: Secondary | ICD-10-CM | POA: Diagnosis not present

## 2013-02-03 DIAGNOSIS — N058 Unspecified nephritic syndrome with other morphologic changes: Secondary | ICD-10-CM | POA: Diagnosis not present

## 2013-03-08 DIAGNOSIS — Z23 Encounter for immunization: Secondary | ICD-10-CM | POA: Diagnosis not present

## 2013-03-23 DIAGNOSIS — Z6829 Body mass index (BMI) 29.0-29.9, adult: Secondary | ICD-10-CM | POA: Diagnosis not present

## 2013-03-23 DIAGNOSIS — E1029 Type 1 diabetes mellitus with other diabetic kidney complication: Secondary | ICD-10-CM | POA: Diagnosis not present

## 2013-03-23 DIAGNOSIS — I10 Essential (primary) hypertension: Secondary | ICD-10-CM | POA: Diagnosis not present

## 2013-03-23 DIAGNOSIS — N058 Unspecified nephritic syndrome with other morphologic changes: Secondary | ICD-10-CM | POA: Diagnosis not present

## 2013-03-23 DIAGNOSIS — Z4681 Encounter for fitting and adjustment of insulin pump: Secondary | ICD-10-CM | POA: Diagnosis not present

## 2013-04-26 DIAGNOSIS — I6529 Occlusion and stenosis of unspecified carotid artery: Secondary | ICD-10-CM | POA: Diagnosis not present

## 2013-04-26 DIAGNOSIS — E1029 Type 1 diabetes mellitus with other diabetic kidney complication: Secondary | ICD-10-CM | POA: Diagnosis not present

## 2013-04-26 DIAGNOSIS — E11319 Type 2 diabetes mellitus with unspecified diabetic retinopathy without macular edema: Secondary | ICD-10-CM | POA: Diagnosis not present

## 2013-04-26 DIAGNOSIS — E785 Hyperlipidemia, unspecified: Secondary | ICD-10-CM | POA: Diagnosis not present

## 2013-04-26 DIAGNOSIS — D126 Benign neoplasm of colon, unspecified: Secondary | ICD-10-CM | POA: Diagnosis not present

## 2013-04-26 DIAGNOSIS — Z1331 Encounter for screening for depression: Secondary | ICD-10-CM | POA: Diagnosis not present

## 2013-04-26 DIAGNOSIS — G4730 Sleep apnea, unspecified: Secondary | ICD-10-CM | POA: Diagnosis not present

## 2013-04-26 DIAGNOSIS — N058 Unspecified nephritic syndrome with other morphologic changes: Secondary | ICD-10-CM | POA: Diagnosis not present

## 2013-04-26 DIAGNOSIS — I798 Other disorders of arteries, arterioles and capillaries in diseases classified elsewhere: Secondary | ICD-10-CM | POA: Diagnosis not present

## 2013-04-26 DIAGNOSIS — I10 Essential (primary) hypertension: Secondary | ICD-10-CM | POA: Diagnosis not present

## 2013-04-27 DIAGNOSIS — E11329 Type 2 diabetes mellitus with mild nonproliferative diabetic retinopathy without macular edema: Secondary | ICD-10-CM | POA: Diagnosis not present

## 2013-04-27 DIAGNOSIS — H3581 Retinal edema: Secondary | ICD-10-CM | POA: Diagnosis not present

## 2013-04-27 DIAGNOSIS — E1039 Type 1 diabetes mellitus with other diabetic ophthalmic complication: Secondary | ICD-10-CM | POA: Diagnosis not present

## 2013-04-27 DIAGNOSIS — E11339 Type 2 diabetes mellitus with moderate nonproliferative diabetic retinopathy without macular edema: Secondary | ICD-10-CM | POA: Diagnosis not present

## 2013-05-16 DIAGNOSIS — N35919 Unspecified urethral stricture, male, unspecified site: Secondary | ICD-10-CM | POA: Diagnosis not present

## 2013-05-16 DIAGNOSIS — E119 Type 2 diabetes mellitus without complications: Secondary | ICD-10-CM | POA: Diagnosis not present

## 2013-05-16 DIAGNOSIS — I10 Essential (primary) hypertension: Secondary | ICD-10-CM | POA: Diagnosis not present

## 2013-05-16 DIAGNOSIS — N529 Male erectile dysfunction, unspecified: Secondary | ICD-10-CM | POA: Diagnosis not present

## 2013-05-16 DIAGNOSIS — C61 Malignant neoplasm of prostate: Secondary | ICD-10-CM | POA: Diagnosis not present

## 2013-05-16 DIAGNOSIS — N393 Stress incontinence (female) (male): Secondary | ICD-10-CM | POA: Diagnosis not present

## 2013-05-16 DIAGNOSIS — K439 Ventral hernia without obstruction or gangrene: Secondary | ICD-10-CM | POA: Diagnosis not present

## 2013-06-28 DIAGNOSIS — H2589 Other age-related cataract: Secondary | ICD-10-CM | POA: Diagnosis not present

## 2013-07-19 DIAGNOSIS — H259 Unspecified age-related cataract: Secondary | ICD-10-CM | POA: Diagnosis not present

## 2013-07-19 DIAGNOSIS — H268 Other specified cataract: Secondary | ICD-10-CM | POA: Diagnosis not present

## 2013-07-19 DIAGNOSIS — H2589 Other age-related cataract: Secondary | ICD-10-CM | POA: Diagnosis not present

## 2013-07-26 DIAGNOSIS — H02059 Trichiasis without entropian unspecified eye, unspecified eyelid: Secondary | ICD-10-CM | POA: Diagnosis not present

## 2013-08-18 DIAGNOSIS — N058 Unspecified nephritic syndrome with other morphologic changes: Secondary | ICD-10-CM | POA: Diagnosis not present

## 2013-08-18 DIAGNOSIS — E1029 Type 1 diabetes mellitus with other diabetic kidney complication: Secondary | ICD-10-CM | POA: Diagnosis not present

## 2013-08-18 DIAGNOSIS — I10 Essential (primary) hypertension: Secondary | ICD-10-CM | POA: Diagnosis not present

## 2013-08-18 DIAGNOSIS — Z4681 Encounter for fitting and adjustment of insulin pump: Secondary | ICD-10-CM | POA: Diagnosis not present

## 2013-09-01 DIAGNOSIS — H524 Presbyopia: Secondary | ICD-10-CM | POA: Diagnosis not present

## 2013-09-02 DIAGNOSIS — E1029 Type 1 diabetes mellitus with other diabetic kidney complication: Secondary | ICD-10-CM | POA: Diagnosis not present

## 2013-09-02 DIAGNOSIS — C61 Malignant neoplasm of prostate: Secondary | ICD-10-CM | POA: Diagnosis not present

## 2013-09-02 DIAGNOSIS — M542 Cervicalgia: Secondary | ICD-10-CM | POA: Diagnosis not present

## 2013-09-13 DIAGNOSIS — H903 Sensorineural hearing loss, bilateral: Secondary | ICD-10-CM | POA: Diagnosis not present

## 2013-10-26 DIAGNOSIS — H35359 Cystoid macular degeneration, unspecified eye: Secondary | ICD-10-CM | POA: Diagnosis not present

## 2013-10-26 DIAGNOSIS — E1039 Type 1 diabetes mellitus with other diabetic ophthalmic complication: Secondary | ICD-10-CM | POA: Diagnosis not present

## 2013-10-26 DIAGNOSIS — E11339 Type 2 diabetes mellitus with moderate nonproliferative diabetic retinopathy without macular edema: Secondary | ICD-10-CM | POA: Diagnosis not present

## 2013-11-18 DIAGNOSIS — H35359 Cystoid macular degeneration, unspecified eye: Secondary | ICD-10-CM | POA: Diagnosis not present

## 2013-11-21 DIAGNOSIS — N529 Male erectile dysfunction, unspecified: Secondary | ICD-10-CM | POA: Diagnosis not present

## 2013-11-21 DIAGNOSIS — N320 Bladder-neck obstruction: Secondary | ICD-10-CM | POA: Diagnosis not present

## 2013-11-21 DIAGNOSIS — C61 Malignant neoplasm of prostate: Secondary | ICD-10-CM | POA: Diagnosis not present

## 2013-11-21 DIAGNOSIS — N393 Stress incontinence (female) (male): Secondary | ICD-10-CM | POA: Diagnosis not present

## 2013-11-22 DIAGNOSIS — I10 Essential (primary) hypertension: Secondary | ICD-10-CM | POA: Diagnosis not present

## 2013-11-22 DIAGNOSIS — E1029 Type 1 diabetes mellitus with other diabetic kidney complication: Secondary | ICD-10-CM | POA: Diagnosis not present

## 2013-11-22 DIAGNOSIS — Z4681 Encounter for fitting and adjustment of insulin pump: Secondary | ICD-10-CM | POA: Diagnosis not present

## 2013-12-12 DIAGNOSIS — E1139 Type 2 diabetes mellitus with other diabetic ophthalmic complication: Secondary | ICD-10-CM | POA: Diagnosis not present

## 2013-12-12 DIAGNOSIS — E11339 Type 2 diabetes mellitus with moderate nonproliferative diabetic retinopathy without macular edema: Secondary | ICD-10-CM | POA: Diagnosis not present

## 2013-12-12 DIAGNOSIS — H35359 Cystoid macular degeneration, unspecified eye: Secondary | ICD-10-CM | POA: Diagnosis not present

## 2014-01-05 DIAGNOSIS — N058 Unspecified nephritic syndrome with other morphologic changes: Secondary | ICD-10-CM | POA: Diagnosis not present

## 2014-01-05 DIAGNOSIS — G4730 Sleep apnea, unspecified: Secondary | ICD-10-CM | POA: Diagnosis not present

## 2014-01-05 DIAGNOSIS — I798 Other disorders of arteries, arterioles and capillaries in diseases classified elsewhere: Secondary | ICD-10-CM | POA: Diagnosis not present

## 2014-01-05 DIAGNOSIS — D126 Benign neoplasm of colon, unspecified: Secondary | ICD-10-CM | POA: Diagnosis not present

## 2014-01-05 DIAGNOSIS — I6529 Occlusion and stenosis of unspecified carotid artery: Secondary | ICD-10-CM | POA: Diagnosis not present

## 2014-01-05 DIAGNOSIS — E1029 Type 1 diabetes mellitus with other diabetic kidney complication: Secondary | ICD-10-CM | POA: Diagnosis not present

## 2014-01-05 DIAGNOSIS — E785 Hyperlipidemia, unspecified: Secondary | ICD-10-CM | POA: Diagnosis not present

## 2014-01-05 DIAGNOSIS — C61 Malignant neoplasm of prostate: Secondary | ICD-10-CM | POA: Diagnosis not present

## 2014-01-23 DIAGNOSIS — E1139 Type 2 diabetes mellitus with other diabetic ophthalmic complication: Secondary | ICD-10-CM | POA: Diagnosis not present

## 2014-01-23 DIAGNOSIS — H35359 Cystoid macular degeneration, unspecified eye: Secondary | ICD-10-CM | POA: Diagnosis not present

## 2014-01-23 DIAGNOSIS — E11339 Type 2 diabetes mellitus with moderate nonproliferative diabetic retinopathy without macular edema: Secondary | ICD-10-CM | POA: Diagnosis not present

## 2014-01-23 DIAGNOSIS — E11329 Type 2 diabetes mellitus with mild nonproliferative diabetic retinopathy without macular edema: Secondary | ICD-10-CM | POA: Diagnosis not present

## 2014-02-03 ENCOUNTER — Encounter: Payer: Self-pay | Admitting: Internal Medicine

## 2014-02-13 DIAGNOSIS — R0989 Other specified symptoms and signs involving the circulatory and respiratory systems: Secondary | ICD-10-CM | POA: Diagnosis not present

## 2014-02-13 DIAGNOSIS — R0609 Other forms of dyspnea: Secondary | ICD-10-CM | POA: Diagnosis not present

## 2014-02-21 DIAGNOSIS — N058 Unspecified nephritic syndrome with other morphologic changes: Secondary | ICD-10-CM | POA: Diagnosis not present

## 2014-02-21 DIAGNOSIS — E1029 Type 1 diabetes mellitus with other diabetic kidney complication: Secondary | ICD-10-CM | POA: Diagnosis not present

## 2014-02-21 DIAGNOSIS — Z6830 Body mass index (BMI) 30.0-30.9, adult: Secondary | ICD-10-CM | POA: Diagnosis not present

## 2014-02-21 DIAGNOSIS — I10 Essential (primary) hypertension: Secondary | ICD-10-CM | POA: Diagnosis not present

## 2014-02-21 DIAGNOSIS — Z4681 Encounter for fitting and adjustment of insulin pump: Secondary | ICD-10-CM | POA: Diagnosis not present

## 2014-04-21 IMAGING — CR DG CHEST 2V
2 series · 2 of 2 positions shown · non-contrast
Comparison: 03/28/2004

CLINICAL DATA: Preop for ventral hernia repair

CHEST - 2 VIEW

[view not recorded (1 of 2)]
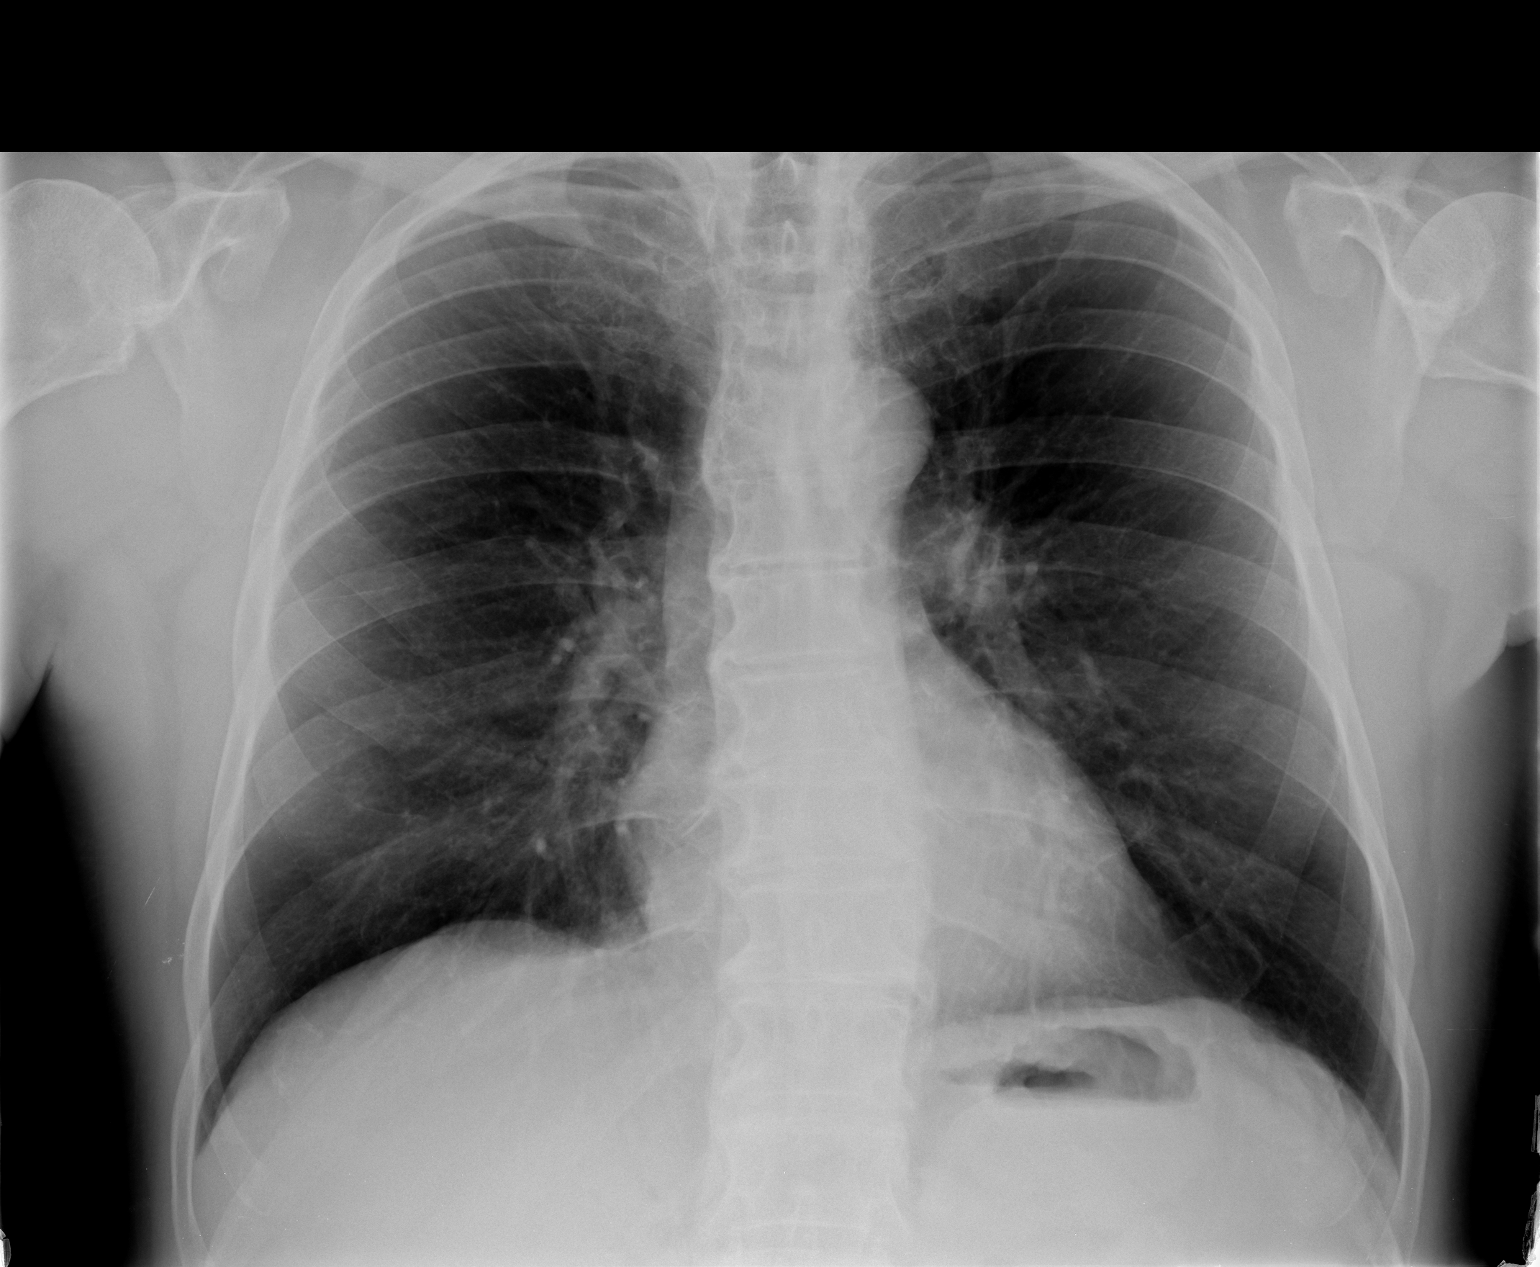

[view not recorded (2 of 2)]
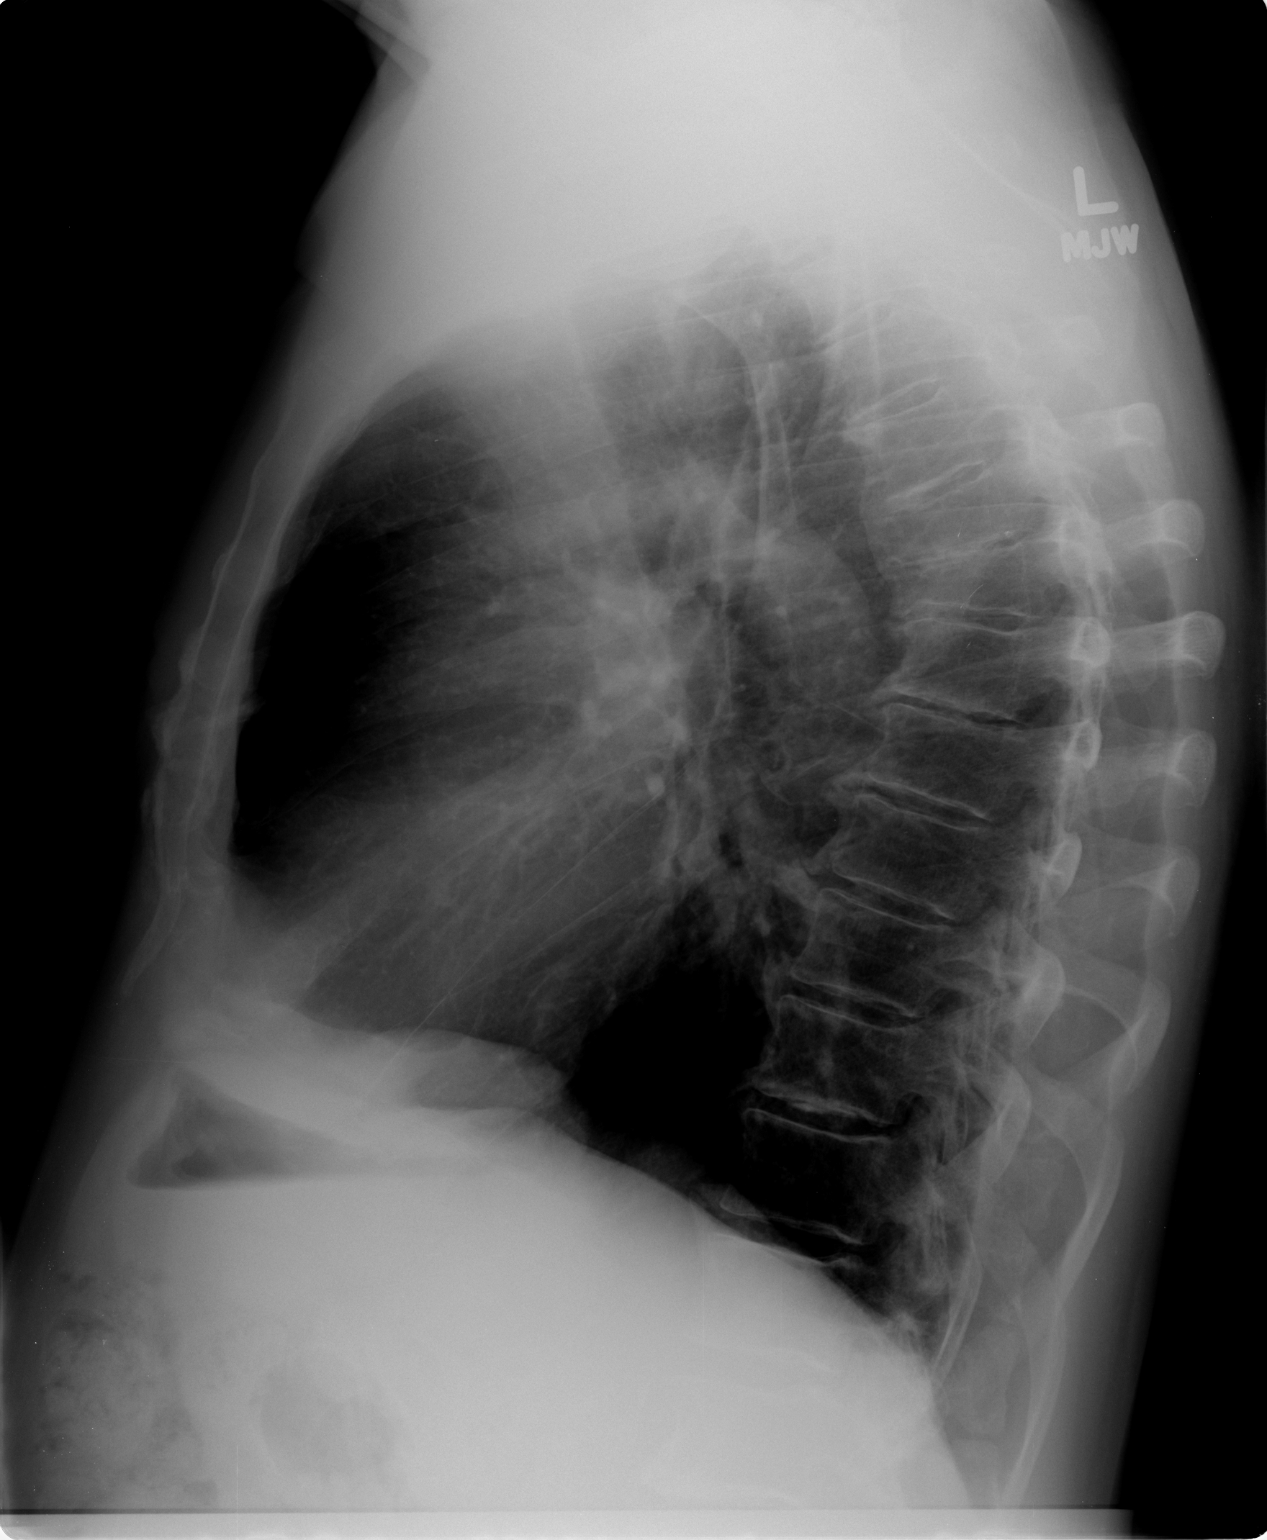

[2 of 2 positions shown; findings below may reference images not displayed]

FINDINGS: Cardiomediastinal silhouette is stable.  No acute
infiltrate or pleural effusion.  No pulmonary edema.  Mild
degenerative changes thoracic spine. There is mild compression
deformity mid thoracic spine of indeterminate age.  Clinical
correlation is necessary.
IMPRESSION: No active disease.  Mild compression deformity mid thoracic spine
of indeterminate age.  Clinical correlation is necessary.

## 2014-04-24 DIAGNOSIS — H35352 Cystoid macular degeneration, left eye: Secondary | ICD-10-CM | POA: Diagnosis not present

## 2014-04-24 DIAGNOSIS — E10339 Type 1 diabetes mellitus with moderate nonproliferative diabetic retinopathy without macular edema: Secondary | ICD-10-CM | POA: Diagnosis not present

## 2014-04-27 DIAGNOSIS — R109 Unspecified abdominal pain: Secondary | ICD-10-CM | POA: Diagnosis not present

## 2014-04-27 DIAGNOSIS — K635 Polyp of colon: Secondary | ICD-10-CM | POA: Diagnosis not present

## 2014-04-27 DIAGNOSIS — K648 Other hemorrhoids: Secondary | ICD-10-CM | POA: Diagnosis not present

## 2014-04-27 DIAGNOSIS — Z86010 Personal history of colonic polyps: Secondary | ICD-10-CM | POA: Diagnosis not present

## 2014-05-09 DIAGNOSIS — E11311 Type 2 diabetes mellitus with unspecified diabetic retinopathy with macular edema: Secondary | ICD-10-CM | POA: Diagnosis not present

## 2014-05-09 DIAGNOSIS — K635 Polyp of colon: Secondary | ICD-10-CM | POA: Diagnosis not present

## 2014-05-09 DIAGNOSIS — N08 Glomerular disorders in diseases classified elsewhere: Secondary | ICD-10-CM | POA: Diagnosis not present

## 2014-05-09 DIAGNOSIS — I10 Essential (primary) hypertension: Secondary | ICD-10-CM | POA: Diagnosis not present

## 2014-05-09 DIAGNOSIS — Z23 Encounter for immunization: Secondary | ICD-10-CM | POA: Diagnosis not present

## 2014-05-09 DIAGNOSIS — G4730 Sleep apnea, unspecified: Secondary | ICD-10-CM | POA: Diagnosis not present

## 2014-05-09 DIAGNOSIS — Z1389 Encounter for screening for other disorder: Secondary | ICD-10-CM | POA: Diagnosis not present

## 2014-05-09 DIAGNOSIS — I6529 Occlusion and stenosis of unspecified carotid artery: Secondary | ICD-10-CM | POA: Diagnosis not present

## 2014-05-09 DIAGNOSIS — E785 Hyperlipidemia, unspecified: Secondary | ICD-10-CM | POA: Diagnosis not present

## 2014-05-09 DIAGNOSIS — E1029 Type 1 diabetes mellitus with other diabetic kidney complication: Secondary | ICD-10-CM | POA: Diagnosis not present

## 2014-05-09 DIAGNOSIS — C61 Malignant neoplasm of prostate: Secondary | ICD-10-CM | POA: Diagnosis not present

## 2014-05-11 DIAGNOSIS — L570 Actinic keratosis: Secondary | ICD-10-CM | POA: Diagnosis not present

## 2014-05-11 DIAGNOSIS — L821 Other seborrheic keratosis: Secondary | ICD-10-CM | POA: Diagnosis not present

## 2014-05-11 DIAGNOSIS — C44629 Squamous cell carcinoma of skin of left upper limb, including shoulder: Secondary | ICD-10-CM | POA: Diagnosis not present

## 2014-05-22 DIAGNOSIS — N528 Other male erectile dysfunction: Secondary | ICD-10-CM | POA: Diagnosis not present

## 2014-05-22 DIAGNOSIS — N320 Bladder-neck obstruction: Secondary | ICD-10-CM | POA: Diagnosis not present

## 2014-05-22 DIAGNOSIS — C61 Malignant neoplasm of prostate: Secondary | ICD-10-CM | POA: Diagnosis not present

## 2014-05-24 DIAGNOSIS — Z6829 Body mass index (BMI) 29.0-29.9, adult: Secondary | ICD-10-CM | POA: Diagnosis not present

## 2014-05-24 DIAGNOSIS — I10 Essential (primary) hypertension: Secondary | ICD-10-CM | POA: Diagnosis not present

## 2014-05-24 DIAGNOSIS — E1029 Type 1 diabetes mellitus with other diabetic kidney complication: Secondary | ICD-10-CM | POA: Diagnosis not present

## 2014-05-24 DIAGNOSIS — Z4681 Encounter for fitting and adjustment of insulin pump: Secondary | ICD-10-CM | POA: Diagnosis not present

## 2014-06-02 DIAGNOSIS — Z794 Long term (current) use of insulin: Secondary | ICD-10-CM | POA: Diagnosis not present

## 2014-06-02 DIAGNOSIS — Z7982 Long term (current) use of aspirin: Secondary | ICD-10-CM | POA: Diagnosis not present

## 2014-06-02 DIAGNOSIS — Z79899 Other long term (current) drug therapy: Secondary | ICD-10-CM | POA: Diagnosis not present

## 2014-06-02 DIAGNOSIS — K449 Diaphragmatic hernia without obstruction or gangrene: Secondary | ICD-10-CM | POA: Diagnosis not present

## 2014-06-02 DIAGNOSIS — I10 Essential (primary) hypertension: Secondary | ICD-10-CM | POA: Diagnosis not present

## 2014-06-02 DIAGNOSIS — E119 Type 2 diabetes mellitus without complications: Secondary | ICD-10-CM | POA: Diagnosis not present

## 2014-06-02 DIAGNOSIS — K219 Gastro-esophageal reflux disease without esophagitis: Secondary | ICD-10-CM | POA: Diagnosis not present

## 2014-06-02 DIAGNOSIS — J392 Other diseases of pharynx: Secondary | ICD-10-CM | POA: Diagnosis not present

## 2014-06-02 DIAGNOSIS — D123 Benign neoplasm of transverse colon: Secondary | ICD-10-CM | POA: Diagnosis not present

## 2014-06-02 DIAGNOSIS — D122 Benign neoplasm of ascending colon: Secondary | ICD-10-CM | POA: Diagnosis not present

## 2014-06-02 DIAGNOSIS — Z86010 Personal history of colonic polyps: Secondary | ICD-10-CM | POA: Diagnosis not present

## 2014-06-02 DIAGNOSIS — K648 Other hemorrhoids: Secondary | ICD-10-CM | POA: Diagnosis not present

## 2014-06-02 DIAGNOSIS — K644 Residual hemorrhoidal skin tags: Secondary | ICD-10-CM | POA: Diagnosis not present

## 2014-06-02 DIAGNOSIS — D126 Benign neoplasm of colon, unspecified: Secondary | ICD-10-CM | POA: Diagnosis not present

## 2014-07-05 DIAGNOSIS — D126 Benign neoplasm of colon, unspecified: Secondary | ICD-10-CM | POA: Diagnosis not present

## 2014-07-05 DIAGNOSIS — K219 Gastro-esophageal reflux disease without esophagitis: Secondary | ICD-10-CM | POA: Diagnosis not present

## 2014-07-18 DIAGNOSIS — J387 Other diseases of larynx: Secondary | ICD-10-CM | POA: Diagnosis not present

## 2014-07-18 DIAGNOSIS — J310 Chronic rhinitis: Secondary | ICD-10-CM | POA: Diagnosis not present

## 2014-07-18 DIAGNOSIS — J392 Other diseases of pharynx: Secondary | ICD-10-CM | POA: Diagnosis not present

## 2014-08-31 DIAGNOSIS — E1029 Type 1 diabetes mellitus with other diabetic kidney complication: Secondary | ICD-10-CM | POA: Diagnosis not present

## 2014-08-31 DIAGNOSIS — I10 Essential (primary) hypertension: Secondary | ICD-10-CM | POA: Diagnosis not present

## 2014-08-31 DIAGNOSIS — Z4681 Encounter for fitting and adjustment of insulin pump: Secondary | ICD-10-CM | POA: Diagnosis not present

## 2014-08-31 DIAGNOSIS — Z6829 Body mass index (BMI) 29.0-29.9, adult: Secondary | ICD-10-CM | POA: Diagnosis not present

## 2014-09-12 DIAGNOSIS — N08 Glomerular disorders in diseases classified elsewhere: Secondary | ICD-10-CM | POA: Diagnosis not present

## 2014-09-12 DIAGNOSIS — Z6828 Body mass index (BMI) 28.0-28.9, adult: Secondary | ICD-10-CM | POA: Diagnosis not present

## 2014-09-12 DIAGNOSIS — E785 Hyperlipidemia, unspecified: Secondary | ICD-10-CM | POA: Diagnosis not present

## 2014-09-12 DIAGNOSIS — E1029 Type 1 diabetes mellitus with other diabetic kidney complication: Secondary | ICD-10-CM | POA: Diagnosis not present

## 2014-09-12 DIAGNOSIS — Z1389 Encounter for screening for other disorder: Secondary | ICD-10-CM | POA: Diagnosis not present

## 2014-09-12 DIAGNOSIS — I6529 Occlusion and stenosis of unspecified carotid artery: Secondary | ICD-10-CM | POA: Diagnosis not present

## 2014-09-12 DIAGNOSIS — C61 Malignant neoplasm of prostate: Secondary | ICD-10-CM | POA: Diagnosis not present

## 2014-09-12 DIAGNOSIS — D126 Benign neoplasm of colon, unspecified: Secondary | ICD-10-CM | POA: Diagnosis not present

## 2014-09-12 DIAGNOSIS — E11311 Type 2 diabetes mellitus with unspecified diabetic retinopathy with macular edema: Secondary | ICD-10-CM | POA: Diagnosis not present

## 2014-09-12 DIAGNOSIS — I10 Essential (primary) hypertension: Secondary | ICD-10-CM | POA: Diagnosis not present

## 2014-09-22 DIAGNOSIS — J392 Other diseases of pharynx: Secondary | ICD-10-CM | POA: Diagnosis not present

## 2014-09-22 DIAGNOSIS — J387 Other diseases of larynx: Secondary | ICD-10-CM | POA: Diagnosis not present

## 2014-09-25 DIAGNOSIS — I10 Essential (primary) hypertension: Secondary | ICD-10-CM | POA: Diagnosis not present

## 2014-09-25 DIAGNOSIS — E785 Hyperlipidemia, unspecified: Secondary | ICD-10-CM | POA: Diagnosis not present

## 2014-09-25 DIAGNOSIS — E119 Type 2 diabetes mellitus without complications: Secondary | ICD-10-CM | POA: Diagnosis not present

## 2014-09-26 DIAGNOSIS — I10 Essential (primary) hypertension: Secondary | ICD-10-CM | POA: Diagnosis not present

## 2014-09-26 DIAGNOSIS — R0602 Shortness of breath: Secondary | ICD-10-CM | POA: Diagnosis not present

## 2014-09-26 DIAGNOSIS — E119 Type 2 diabetes mellitus without complications: Secondary | ICD-10-CM | POA: Diagnosis not present

## 2014-09-26 DIAGNOSIS — E785 Hyperlipidemia, unspecified: Secondary | ICD-10-CM | POA: Diagnosis not present

## 2014-10-18 DIAGNOSIS — H35352 Cystoid macular degeneration, left eye: Secondary | ICD-10-CM | POA: Diagnosis not present

## 2014-10-18 DIAGNOSIS — E10339 Type 1 diabetes mellitus with moderate nonproliferative diabetic retinopathy without macular edema: Secondary | ICD-10-CM | POA: Diagnosis not present

## 2014-11-01 DIAGNOSIS — E119 Type 2 diabetes mellitus without complications: Secondary | ICD-10-CM | POA: Diagnosis not present

## 2014-11-01 DIAGNOSIS — I10 Essential (primary) hypertension: Secondary | ICD-10-CM | POA: Diagnosis not present

## 2014-11-01 DIAGNOSIS — R001 Bradycardia, unspecified: Secondary | ICD-10-CM | POA: Diagnosis not present

## 2014-11-01 DIAGNOSIS — E785 Hyperlipidemia, unspecified: Secondary | ICD-10-CM | POA: Diagnosis not present

## 2014-11-08 DIAGNOSIS — R001 Bradycardia, unspecified: Secondary | ICD-10-CM | POA: Diagnosis not present

## 2014-11-08 DIAGNOSIS — I495 Sick sinus syndrome: Secondary | ICD-10-CM | POA: Diagnosis not present

## 2014-11-09 DIAGNOSIS — R001 Bradycardia, unspecified: Secondary | ICD-10-CM | POA: Diagnosis not present

## 2014-12-06 DIAGNOSIS — I10 Essential (primary) hypertension: Secondary | ICD-10-CM | POA: Diagnosis not present

## 2014-12-06 DIAGNOSIS — E1029 Type 1 diabetes mellitus with other diabetic kidney complication: Secondary | ICD-10-CM | POA: Diagnosis not present

## 2014-12-06 DIAGNOSIS — Z6829 Body mass index (BMI) 29.0-29.9, adult: Secondary | ICD-10-CM | POA: Diagnosis not present

## 2014-12-06 DIAGNOSIS — Z4681 Encounter for fitting and adjustment of insulin pump: Secondary | ICD-10-CM | POA: Diagnosis not present

## 2015-01-05 DIAGNOSIS — H26492 Other secondary cataract, left eye: Secondary | ICD-10-CM | POA: Diagnosis not present

## 2015-01-15 DIAGNOSIS — J392 Other diseases of pharynx: Secondary | ICD-10-CM | POA: Diagnosis not present

## 2015-01-15 DIAGNOSIS — K219 Gastro-esophageal reflux disease without esophagitis: Secondary | ICD-10-CM | POA: Diagnosis not present

## 2015-01-16 DIAGNOSIS — I10 Essential (primary) hypertension: Secondary | ICD-10-CM | POA: Diagnosis not present

## 2015-01-16 DIAGNOSIS — I6529 Occlusion and stenosis of unspecified carotid artery: Secondary | ICD-10-CM | POA: Diagnosis not present

## 2015-01-16 DIAGNOSIS — N08 Glomerular disorders in diseases classified elsewhere: Secondary | ICD-10-CM | POA: Diagnosis not present

## 2015-01-16 DIAGNOSIS — E1029 Type 1 diabetes mellitus with other diabetic kidney complication: Secondary | ICD-10-CM | POA: Diagnosis not present

## 2015-01-16 DIAGNOSIS — C61 Malignant neoplasm of prostate: Secondary | ICD-10-CM | POA: Diagnosis not present

## 2015-01-16 DIAGNOSIS — E11311 Type 2 diabetes mellitus with unspecified diabetic retinopathy with macular edema: Secondary | ICD-10-CM | POA: Diagnosis not present

## 2015-01-16 DIAGNOSIS — Z6829 Body mass index (BMI) 29.0-29.9, adult: Secondary | ICD-10-CM | POA: Diagnosis not present

## 2015-01-16 DIAGNOSIS — I798 Other disorders of arteries, arterioles and capillaries in diseases classified elsewhere: Secondary | ICD-10-CM | POA: Diagnosis not present

## 2015-01-16 DIAGNOSIS — G4730 Sleep apnea, unspecified: Secondary | ICD-10-CM | POA: Diagnosis not present

## 2015-01-16 DIAGNOSIS — E785 Hyperlipidemia, unspecified: Secondary | ICD-10-CM | POA: Diagnosis not present

## 2015-01-16 DIAGNOSIS — J392 Other diseases of pharynx: Secondary | ICD-10-CM | POA: Diagnosis not present

## 2015-01-19 DIAGNOSIS — H5213 Myopia, bilateral: Secondary | ICD-10-CM | POA: Diagnosis not present

## 2015-02-21 DIAGNOSIS — E10339 Type 1 diabetes mellitus with moderate nonproliferative diabetic retinopathy without macular edema: Secondary | ICD-10-CM | POA: Diagnosis not present

## 2015-02-21 DIAGNOSIS — H35352 Cystoid macular degeneration, left eye: Secondary | ICD-10-CM | POA: Diagnosis not present

## 2015-03-13 DIAGNOSIS — Z4681 Encounter for fitting and adjustment of insulin pump: Secondary | ICD-10-CM | POA: Diagnosis not present

## 2015-03-13 DIAGNOSIS — Z23 Encounter for immunization: Secondary | ICD-10-CM | POA: Diagnosis not present

## 2015-03-13 DIAGNOSIS — I10 Essential (primary) hypertension: Secondary | ICD-10-CM | POA: Diagnosis not present

## 2015-03-13 DIAGNOSIS — Z6829 Body mass index (BMI) 29.0-29.9, adult: Secondary | ICD-10-CM | POA: Diagnosis not present

## 2015-03-13 DIAGNOSIS — E1029 Type 1 diabetes mellitus with other diabetic kidney complication: Secondary | ICD-10-CM | POA: Diagnosis not present

## 2015-03-13 DIAGNOSIS — N08 Glomerular disorders in diseases classified elsewhere: Secondary | ICD-10-CM | POA: Diagnosis not present

## 2015-04-25 DIAGNOSIS — H35352 Cystoid macular degeneration, left eye: Secondary | ICD-10-CM | POA: Diagnosis not present

## 2015-04-25 DIAGNOSIS — E103391 Type 1 diabetes mellitus with moderate nonproliferative diabetic retinopathy without macular edema, right eye: Secondary | ICD-10-CM | POA: Diagnosis not present

## 2015-04-25 DIAGNOSIS — E103312 Type 1 diabetes mellitus with moderate nonproliferative diabetic retinopathy with macular edema, left eye: Secondary | ICD-10-CM | POA: Diagnosis not present

## 2015-05-14 DIAGNOSIS — N320 Bladder-neck obstruction: Secondary | ICD-10-CM | POA: Diagnosis not present

## 2015-05-14 DIAGNOSIS — N5231 Erectile dysfunction following radical prostatectomy: Secondary | ICD-10-CM | POA: Diagnosis not present

## 2015-05-14 DIAGNOSIS — C61 Malignant neoplasm of prostate: Secondary | ICD-10-CM | POA: Diagnosis not present

## 2015-05-14 DIAGNOSIS — Z8546 Personal history of malignant neoplasm of prostate: Secondary | ICD-10-CM | POA: Diagnosis not present

## 2015-05-14 DIAGNOSIS — N393 Stress incontinence (female) (male): Secondary | ICD-10-CM | POA: Diagnosis not present

## 2015-05-15 DIAGNOSIS — L820 Inflamed seborrheic keratosis: Secondary | ICD-10-CM | POA: Diagnosis not present

## 2015-05-15 DIAGNOSIS — L300 Nummular dermatitis: Secondary | ICD-10-CM | POA: Diagnosis not present

## 2015-05-29 DIAGNOSIS — C61 Malignant neoplasm of prostate: Secondary | ICD-10-CM | POA: Diagnosis not present

## 2015-05-29 DIAGNOSIS — N08 Glomerular disorders in diseases classified elsewhere: Secondary | ICD-10-CM | POA: Diagnosis not present

## 2015-05-29 DIAGNOSIS — E784 Other hyperlipidemia: Secondary | ICD-10-CM | POA: Diagnosis not present

## 2015-05-29 DIAGNOSIS — E11311 Type 2 diabetes mellitus with unspecified diabetic retinopathy with macular edema: Secondary | ICD-10-CM | POA: Diagnosis not present

## 2015-05-29 DIAGNOSIS — E1029 Type 1 diabetes mellitus with other diabetic kidney complication: Secondary | ICD-10-CM | POA: Diagnosis not present

## 2015-05-29 DIAGNOSIS — Z6829 Body mass index (BMI) 29.0-29.9, adult: Secondary | ICD-10-CM | POA: Diagnosis not present

## 2015-05-29 DIAGNOSIS — H409 Unspecified glaucoma: Secondary | ICD-10-CM | POA: Diagnosis not present

## 2015-05-29 DIAGNOSIS — Z1389 Encounter for screening for other disorder: Secondary | ICD-10-CM | POA: Diagnosis not present

## 2015-05-29 DIAGNOSIS — I10 Essential (primary) hypertension: Secondary | ICD-10-CM | POA: Diagnosis not present

## 2015-05-29 DIAGNOSIS — J392 Other diseases of pharynx: Secondary | ICD-10-CM | POA: Diagnosis not present

## 2015-07-17 DIAGNOSIS — E1029 Type 1 diabetes mellitus with other diabetic kidney complication: Secondary | ICD-10-CM | POA: Diagnosis not present

## 2015-07-17 DIAGNOSIS — I10 Essential (primary) hypertension: Secondary | ICD-10-CM | POA: Diagnosis not present

## 2015-07-17 DIAGNOSIS — Z4681 Encounter for fitting and adjustment of insulin pump: Secondary | ICD-10-CM | POA: Diagnosis not present

## 2015-07-17 DIAGNOSIS — Z6829 Body mass index (BMI) 29.0-29.9, adult: Secondary | ICD-10-CM | POA: Diagnosis not present

## 2015-07-17 DIAGNOSIS — N08 Glomerular disorders in diseases classified elsewhere: Secondary | ICD-10-CM | POA: Diagnosis not present

## 2015-07-18 DIAGNOSIS — H43811 Vitreous degeneration, right eye: Secondary | ICD-10-CM | POA: Diagnosis not present

## 2015-07-18 DIAGNOSIS — E103393 Type 1 diabetes mellitus with moderate nonproliferative diabetic retinopathy without macular edema, bilateral: Secondary | ICD-10-CM | POA: Diagnosis not present

## 2015-07-18 DIAGNOSIS — H35352 Cystoid macular degeneration, left eye: Secondary | ICD-10-CM | POA: Diagnosis not present

## 2015-08-09 DIAGNOSIS — J392 Other diseases of pharynx: Secondary | ICD-10-CM | POA: Diagnosis not present

## 2015-08-09 DIAGNOSIS — J00 Acute nasopharyngitis [common cold]: Secondary | ICD-10-CM | POA: Diagnosis not present

## 2015-08-09 DIAGNOSIS — J387 Other diseases of larynx: Secondary | ICD-10-CM | POA: Diagnosis not present

## 2015-10-01 DIAGNOSIS — N08 Glomerular disorders in diseases classified elsewhere: Secondary | ICD-10-CM | POA: Diagnosis not present

## 2015-10-01 DIAGNOSIS — I10 Essential (primary) hypertension: Secondary | ICD-10-CM | POA: Diagnosis not present

## 2015-10-01 DIAGNOSIS — Z6829 Body mass index (BMI) 29.0-29.9, adult: Secondary | ICD-10-CM | POA: Diagnosis not present

## 2015-10-01 DIAGNOSIS — C61 Malignant neoplasm of prostate: Secondary | ICD-10-CM | POA: Diagnosis not present

## 2015-10-01 DIAGNOSIS — E1029 Type 1 diabetes mellitus with other diabetic kidney complication: Secondary | ICD-10-CM | POA: Diagnosis not present

## 2015-10-01 DIAGNOSIS — E784 Other hyperlipidemia: Secondary | ICD-10-CM | POA: Diagnosis not present

## 2015-10-01 DIAGNOSIS — G4730 Sleep apnea, unspecified: Secondary | ICD-10-CM | POA: Diagnosis not present

## 2015-10-01 DIAGNOSIS — F329 Major depressive disorder, single episode, unspecified: Secondary | ICD-10-CM | POA: Diagnosis not present

## 2015-10-01 DIAGNOSIS — E11311 Type 2 diabetes mellitus with unspecified diabetic retinopathy with macular edema: Secondary | ICD-10-CM | POA: Diagnosis not present

## 2015-12-12 DIAGNOSIS — H43811 Vitreous degeneration, right eye: Secondary | ICD-10-CM | POA: Diagnosis not present

## 2015-12-12 DIAGNOSIS — E113391 Type 2 diabetes mellitus with moderate nonproliferative diabetic retinopathy without macular edema, right eye: Secondary | ICD-10-CM | POA: Diagnosis not present

## 2015-12-12 DIAGNOSIS — E113312 Type 2 diabetes mellitus with moderate nonproliferative diabetic retinopathy with macular edema, left eye: Secondary | ICD-10-CM | POA: Diagnosis not present

## 2016-01-16 DIAGNOSIS — I10 Essential (primary) hypertension: Secondary | ICD-10-CM | POA: Diagnosis not present

## 2016-01-16 DIAGNOSIS — Z4681 Encounter for fitting and adjustment of insulin pump: Secondary | ICD-10-CM | POA: Diagnosis not present

## 2016-01-16 DIAGNOSIS — N08 Glomerular disorders in diseases classified elsewhere: Secondary | ICD-10-CM | POA: Diagnosis not present

## 2016-01-16 DIAGNOSIS — E1029 Type 1 diabetes mellitus with other diabetic kidney complication: Secondary | ICD-10-CM | POA: Diagnosis not present

## 2016-01-16 DIAGNOSIS — Z23 Encounter for immunization: Secondary | ICD-10-CM | POA: Diagnosis not present

## 2016-02-11 DIAGNOSIS — J387 Other diseases of larynx: Secondary | ICD-10-CM | POA: Diagnosis not present

## 2016-02-11 DIAGNOSIS — K219 Gastro-esophageal reflux disease without esophagitis: Secondary | ICD-10-CM | POA: Diagnosis not present

## 2016-02-11 DIAGNOSIS — J392 Other diseases of pharynx: Secondary | ICD-10-CM | POA: Diagnosis not present

## 2016-02-12 DIAGNOSIS — I10 Essential (primary) hypertension: Secondary | ICD-10-CM | POA: Diagnosis not present

## 2016-02-12 DIAGNOSIS — C61 Malignant neoplasm of prostate: Secondary | ICD-10-CM | POA: Diagnosis not present

## 2016-02-12 DIAGNOSIS — N08 Glomerular disorders in diseases classified elsewhere: Secondary | ICD-10-CM | POA: Diagnosis not present

## 2016-02-12 DIAGNOSIS — G4739 Other sleep apnea: Secondary | ICD-10-CM | POA: Diagnosis not present

## 2016-02-12 DIAGNOSIS — E784 Other hyperlipidemia: Secondary | ICD-10-CM | POA: Diagnosis not present

## 2016-02-12 DIAGNOSIS — J392 Other diseases of pharynx: Secondary | ICD-10-CM | POA: Diagnosis not present

## 2016-02-12 DIAGNOSIS — H4089 Other specified glaucoma: Secondary | ICD-10-CM | POA: Diagnosis not present

## 2016-02-12 DIAGNOSIS — Z6829 Body mass index (BMI) 29.0-29.9, adult: Secondary | ICD-10-CM | POA: Diagnosis not present

## 2016-02-12 DIAGNOSIS — K635 Polyp of colon: Secondary | ICD-10-CM | POA: Diagnosis not present

## 2016-02-12 DIAGNOSIS — E1029 Type 1 diabetes mellitus with other diabetic kidney complication: Secondary | ICD-10-CM | POA: Diagnosis not present

## 2016-02-12 DIAGNOSIS — E11311 Type 2 diabetes mellitus with unspecified diabetic retinopathy with macular edema: Secondary | ICD-10-CM | POA: Diagnosis not present

## 2016-02-12 DIAGNOSIS — I6529 Occlusion and stenosis of unspecified carotid artery: Secondary | ICD-10-CM | POA: Diagnosis not present

## 2016-03-11 DIAGNOSIS — H3581 Retinal edema: Secondary | ICD-10-CM | POA: Diagnosis not present

## 2016-03-11 DIAGNOSIS — H43811 Vitreous degeneration, right eye: Secondary | ICD-10-CM | POA: Diagnosis not present

## 2016-03-11 DIAGNOSIS — E113393 Type 2 diabetes mellitus with moderate nonproliferative diabetic retinopathy without macular edema, bilateral: Secondary | ICD-10-CM | POA: Diagnosis not present

## 2016-04-16 DIAGNOSIS — Z6829 Body mass index (BMI) 29.0-29.9, adult: Secondary | ICD-10-CM | POA: Diagnosis not present

## 2016-04-16 DIAGNOSIS — I10 Essential (primary) hypertension: Secondary | ICD-10-CM | POA: Diagnosis not present

## 2016-04-16 DIAGNOSIS — E1029 Type 1 diabetes mellitus with other diabetic kidney complication: Secondary | ICD-10-CM | POA: Diagnosis not present

## 2016-04-16 DIAGNOSIS — Z4681 Encounter for fitting and adjustment of insulin pump: Secondary | ICD-10-CM | POA: Diagnosis not present

## 2016-05-12 DIAGNOSIS — N393 Stress incontinence (female) (male): Secondary | ICD-10-CM | POA: Diagnosis not present

## 2016-05-12 DIAGNOSIS — Z8546 Personal history of malignant neoplasm of prostate: Secondary | ICD-10-CM | POA: Diagnosis not present

## 2016-05-12 DIAGNOSIS — N529 Male erectile dysfunction, unspecified: Secondary | ICD-10-CM | POA: Diagnosis not present

## 2016-05-12 DIAGNOSIS — C61 Malignant neoplasm of prostate: Secondary | ICD-10-CM | POA: Diagnosis not present

## 2016-05-12 DIAGNOSIS — N320 Bladder-neck obstruction: Secondary | ICD-10-CM | POA: Diagnosis not present

## 2016-05-15 DIAGNOSIS — L918 Other hypertrophic disorders of the skin: Secondary | ICD-10-CM | POA: Diagnosis not present

## 2016-05-15 DIAGNOSIS — L821 Other seborrheic keratosis: Secondary | ICD-10-CM | POA: Diagnosis not present

## 2016-05-15 DIAGNOSIS — L570 Actinic keratosis: Secondary | ICD-10-CM | POA: Diagnosis not present

## 2016-05-15 DIAGNOSIS — C44629 Squamous cell carcinoma of skin of left upper limb, including shoulder: Secondary | ICD-10-CM | POA: Diagnosis not present

## 2016-05-22 DIAGNOSIS — E11311 Type 2 diabetes mellitus with unspecified diabetic retinopathy with macular edema: Secondary | ICD-10-CM | POA: Diagnosis not present

## 2016-05-22 DIAGNOSIS — J392 Other diseases of pharynx: Secondary | ICD-10-CM | POA: Diagnosis not present

## 2016-05-22 DIAGNOSIS — E1029 Type 1 diabetes mellitus with other diabetic kidney complication: Secondary | ICD-10-CM | POA: Diagnosis not present

## 2016-05-22 DIAGNOSIS — H4089 Other specified glaucoma: Secondary | ICD-10-CM | POA: Diagnosis not present

## 2016-05-22 DIAGNOSIS — N08 Glomerular disorders in diseases classified elsewhere: Secondary | ICD-10-CM | POA: Diagnosis not present

## 2016-05-22 DIAGNOSIS — I10 Essential (primary) hypertension: Secondary | ICD-10-CM | POA: Diagnosis not present

## 2016-05-22 DIAGNOSIS — Z6829 Body mass index (BMI) 29.0-29.9, adult: Secondary | ICD-10-CM | POA: Diagnosis not present

## 2016-05-22 DIAGNOSIS — C61 Malignant neoplasm of prostate: Secondary | ICD-10-CM | POA: Diagnosis not present

## 2016-05-22 DIAGNOSIS — E784 Other hyperlipidemia: Secondary | ICD-10-CM | POA: Diagnosis not present

## 2016-07-02 DIAGNOSIS — C44629 Squamous cell carcinoma of skin of left upper limb, including shoulder: Secondary | ICD-10-CM | POA: Diagnosis not present

## 2016-07-17 DIAGNOSIS — N08 Glomerular disorders in diseases classified elsewhere: Secondary | ICD-10-CM | POA: Diagnosis not present

## 2016-07-17 DIAGNOSIS — E1029 Type 1 diabetes mellitus with other diabetic kidney complication: Secondary | ICD-10-CM | POA: Diagnosis not present

## 2016-07-17 DIAGNOSIS — Z4681 Encounter for fitting and adjustment of insulin pump: Secondary | ICD-10-CM | POA: Diagnosis not present

## 2016-07-17 DIAGNOSIS — I10 Essential (primary) hypertension: Secondary | ICD-10-CM | POA: Diagnosis not present

## 2016-08-08 DIAGNOSIS — I10 Essential (primary) hypertension: Secondary | ICD-10-CM | POA: Diagnosis not present

## 2016-08-08 DIAGNOSIS — N08 Glomerular disorders in diseases classified elsewhere: Secondary | ICD-10-CM | POA: Diagnosis not present

## 2016-08-08 DIAGNOSIS — Z6828 Body mass index (BMI) 28.0-28.9, adult: Secondary | ICD-10-CM | POA: Diagnosis not present

## 2016-08-08 DIAGNOSIS — E1029 Type 1 diabetes mellitus with other diabetic kidney complication: Secondary | ICD-10-CM | POA: Diagnosis not present

## 2016-08-08 DIAGNOSIS — Z4681 Encounter for fitting and adjustment of insulin pump: Secondary | ICD-10-CM | POA: Diagnosis not present

## 2016-08-19 DIAGNOSIS — E784 Other hyperlipidemia: Secondary | ICD-10-CM | POA: Diagnosis not present

## 2016-08-19 DIAGNOSIS — Z1389 Encounter for screening for other disorder: Secondary | ICD-10-CM | POA: Diagnosis not present

## 2016-08-19 DIAGNOSIS — Z6828 Body mass index (BMI) 28.0-28.9, adult: Secondary | ICD-10-CM | POA: Diagnosis not present

## 2016-08-19 DIAGNOSIS — G4730 Sleep apnea, unspecified: Secondary | ICD-10-CM | POA: Diagnosis not present

## 2016-08-19 DIAGNOSIS — K635 Polyp of colon: Secondary | ICD-10-CM | POA: Diagnosis not present

## 2016-08-19 DIAGNOSIS — N08 Glomerular disorders in diseases classified elsewhere: Secondary | ICD-10-CM | POA: Diagnosis not present

## 2016-08-19 DIAGNOSIS — E1029 Type 1 diabetes mellitus with other diabetic kidney complication: Secondary | ICD-10-CM | POA: Diagnosis not present

## 2016-08-19 DIAGNOSIS — I798 Other disorders of arteries, arterioles and capillaries in diseases classified elsewhere: Secondary | ICD-10-CM | POA: Diagnosis not present

## 2016-08-19 DIAGNOSIS — E11311 Type 2 diabetes mellitus with unspecified diabetic retinopathy with macular edema: Secondary | ICD-10-CM | POA: Diagnosis not present

## 2016-08-19 DIAGNOSIS — I6529 Occlusion and stenosis of unspecified carotid artery: Secondary | ICD-10-CM | POA: Diagnosis not present

## 2016-08-19 DIAGNOSIS — I10 Essential (primary) hypertension: Secondary | ICD-10-CM | POA: Diagnosis not present

## 2016-08-19 DIAGNOSIS — C61 Malignant neoplasm of prostate: Secondary | ICD-10-CM | POA: Diagnosis not present

## 2016-08-29 DIAGNOSIS — Z4681 Encounter for fitting and adjustment of insulin pump: Secondary | ICD-10-CM | POA: Diagnosis not present

## 2016-08-29 DIAGNOSIS — N08 Glomerular disorders in diseases classified elsewhere: Secondary | ICD-10-CM | POA: Diagnosis not present

## 2016-08-29 DIAGNOSIS — Z6829 Body mass index (BMI) 29.0-29.9, adult: Secondary | ICD-10-CM | POA: Diagnosis not present

## 2016-08-29 DIAGNOSIS — I10 Essential (primary) hypertension: Secondary | ICD-10-CM | POA: Diagnosis not present

## 2016-08-29 DIAGNOSIS — E1029 Type 1 diabetes mellitus with other diabetic kidney complication: Secondary | ICD-10-CM | POA: Diagnosis not present

## 2016-09-09 DIAGNOSIS — H43811 Vitreous degeneration, right eye: Secondary | ICD-10-CM | POA: Diagnosis not present

## 2016-09-09 DIAGNOSIS — E113291 Type 2 diabetes mellitus with mild nonproliferative diabetic retinopathy without macular edema, right eye: Secondary | ICD-10-CM | POA: Diagnosis not present

## 2016-09-09 DIAGNOSIS — E113312 Type 2 diabetes mellitus with moderate nonproliferative diabetic retinopathy with macular edema, left eye: Secondary | ICD-10-CM | POA: Diagnosis not present

## 2016-09-17 DIAGNOSIS — E113312 Type 2 diabetes mellitus with moderate nonproliferative diabetic retinopathy with macular edema, left eye: Secondary | ICD-10-CM | POA: Diagnosis not present

## 2016-10-01 DIAGNOSIS — I10 Essential (primary) hypertension: Secondary | ICD-10-CM | POA: Diagnosis not present

## 2016-10-01 DIAGNOSIS — E1029 Type 1 diabetes mellitus with other diabetic kidney complication: Secondary | ICD-10-CM | POA: Diagnosis not present

## 2016-10-01 DIAGNOSIS — Z4681 Encounter for fitting and adjustment of insulin pump: Secondary | ICD-10-CM | POA: Diagnosis not present

## 2016-10-21 DIAGNOSIS — E113312 Type 2 diabetes mellitus with moderate nonproliferative diabetic retinopathy with macular edema, left eye: Secondary | ICD-10-CM | POA: Diagnosis not present

## 2016-10-21 DIAGNOSIS — E113291 Type 2 diabetes mellitus with mild nonproliferative diabetic retinopathy without macular edema, right eye: Secondary | ICD-10-CM | POA: Diagnosis not present

## 2016-10-21 DIAGNOSIS — H43811 Vitreous degeneration, right eye: Secondary | ICD-10-CM | POA: Diagnosis not present

## 2016-10-24 DIAGNOSIS — F411 Generalized anxiety disorder: Secondary | ICD-10-CM | POA: Diagnosis not present

## 2016-10-24 DIAGNOSIS — Z6827 Body mass index (BMI) 27.0-27.9, adult: Secondary | ICD-10-CM | POA: Diagnosis not present

## 2016-10-24 DIAGNOSIS — R51 Headache: Secondary | ICD-10-CM | POA: Diagnosis not present

## 2016-10-24 DIAGNOSIS — I10 Essential (primary) hypertension: Secondary | ICD-10-CM | POA: Diagnosis not present

## 2016-10-24 DIAGNOSIS — N08 Glomerular disorders in diseases classified elsewhere: Secondary | ICD-10-CM | POA: Diagnosis not present

## 2016-10-24 DIAGNOSIS — R251 Tremor, unspecified: Secondary | ICD-10-CM | POA: Diagnosis not present

## 2016-10-24 DIAGNOSIS — E784 Other hyperlipidemia: Secondary | ICD-10-CM | POA: Diagnosis not present

## 2016-10-24 DIAGNOSIS — E1029 Type 1 diabetes mellitus with other diabetic kidney complication: Secondary | ICD-10-CM | POA: Diagnosis not present

## 2016-10-24 DIAGNOSIS — Z1389 Encounter for screening for other disorder: Secondary | ICD-10-CM | POA: Diagnosis not present

## 2016-10-28 ENCOUNTER — Other Ambulatory Visit: Payer: Self-pay | Admitting: Endocrinology

## 2016-10-28 ENCOUNTER — Ambulatory Visit
Admission: RE | Admit: 2016-10-28 | Discharge: 2016-10-28 | Disposition: A | Discharge status: Home / Self Care | Payer: Medicare Other | Source: Ambulatory Visit | Attending: Endocrinology | Admitting: Endocrinology

## 2016-10-28 DIAGNOSIS — R251 Tremor, unspecified: Secondary | ICD-10-CM

## 2016-10-28 DIAGNOSIS — E1029 Type 1 diabetes mellitus with other diabetic kidney complication: Secondary | ICD-10-CM | POA: Diagnosis not present

## 2016-10-28 DIAGNOSIS — G4452 New daily persistent headache (NDPH): Secondary | ICD-10-CM

## 2016-10-28 DIAGNOSIS — F411 Generalized anxiety disorder: Secondary | ICD-10-CM | POA: Diagnosis not present

## 2016-10-28 DIAGNOSIS — S0990XA Unspecified injury of head, initial encounter: Secondary | ICD-10-CM | POA: Diagnosis not present

## 2016-10-28 DIAGNOSIS — R51 Headache: Secondary | ICD-10-CM | POA: Diagnosis not present

## 2016-10-28 DIAGNOSIS — I10 Essential (primary) hypertension: Secondary | ICD-10-CM | POA: Diagnosis not present

## 2016-10-29 DIAGNOSIS — L821 Other seborrheic keratosis: Secondary | ICD-10-CM | POA: Diagnosis not present

## 2016-10-29 DIAGNOSIS — L814 Other melanin hyperpigmentation: Secondary | ICD-10-CM | POA: Diagnosis not present

## 2016-10-29 DIAGNOSIS — D225 Melanocytic nevi of trunk: Secondary | ICD-10-CM | POA: Diagnosis not present

## 2016-10-29 DIAGNOSIS — D1801 Hemangioma of skin and subcutaneous tissue: Secondary | ICD-10-CM | POA: Diagnosis not present

## 2016-10-29 DIAGNOSIS — Z85828 Personal history of other malignant neoplasm of skin: Secondary | ICD-10-CM | POA: Diagnosis not present

## 2016-10-29 DIAGNOSIS — L720 Epidermal cyst: Secondary | ICD-10-CM | POA: Diagnosis not present

## 2016-10-29 DIAGNOSIS — L570 Actinic keratosis: Secondary | ICD-10-CM | POA: Diagnosis not present

## 2016-10-29 DIAGNOSIS — C44629 Squamous cell carcinoma of skin of left upper limb, including shoulder: Secondary | ICD-10-CM | POA: Diagnosis not present

## 2016-11-04 DIAGNOSIS — R0789 Other chest pain: Secondary | ICD-10-CM | POA: Diagnosis not present

## 2016-11-04 DIAGNOSIS — R51 Headache: Secondary | ICD-10-CM | POA: Diagnosis not present

## 2016-11-04 DIAGNOSIS — I10 Essential (primary) hypertension: Secondary | ICD-10-CM | POA: Diagnosis not present

## 2016-11-04 DIAGNOSIS — F411 Generalized anxiety disorder: Secondary | ICD-10-CM | POA: Diagnosis not present

## 2016-11-04 DIAGNOSIS — Z6827 Body mass index (BMI) 27.0-27.9, adult: Secondary | ICD-10-CM | POA: Diagnosis not present

## 2016-11-04 DIAGNOSIS — E1029 Type 1 diabetes mellitus with other diabetic kidney complication: Secondary | ICD-10-CM | POA: Diagnosis not present

## 2016-11-04 DIAGNOSIS — F329 Major depressive disorder, single episode, unspecified: Secondary | ICD-10-CM | POA: Diagnosis not present

## 2016-11-14 DIAGNOSIS — Z6827 Body mass index (BMI) 27.0-27.9, adult: Secondary | ICD-10-CM | POA: Diagnosis not present

## 2016-11-14 DIAGNOSIS — F411 Generalized anxiety disorder: Secondary | ICD-10-CM | POA: Diagnosis not present

## 2016-11-14 DIAGNOSIS — R51 Headache: Secondary | ICD-10-CM | POA: Diagnosis not present

## 2016-11-14 DIAGNOSIS — E1029 Type 1 diabetes mellitus with other diabetic kidney complication: Secondary | ICD-10-CM | POA: Diagnosis not present

## 2016-11-14 DIAGNOSIS — I10 Essential (primary) hypertension: Secondary | ICD-10-CM | POA: Diagnosis not present

## 2016-11-18 ENCOUNTER — Ambulatory Visit (INDEPENDENT_AMBULATORY_CARE_PROVIDER_SITE_OTHER): Payer: Medicare Other | Admitting: Neurology

## 2016-11-18 ENCOUNTER — Encounter: Payer: Self-pay | Admitting: Neurology

## 2016-11-18 ENCOUNTER — Other Ambulatory Visit: Payer: Self-pay

## 2016-11-18 VITALS — BP 150/70 | HR 75 | Ht 67.0 in | Wt 169.0 lb

## 2016-11-18 DIAGNOSIS — G44209 Tension-type headache, unspecified, not intractable: Secondary | ICD-10-CM | POA: Diagnosis not present

## 2016-11-18 DIAGNOSIS — I10 Essential (primary) hypertension: Secondary | ICD-10-CM

## 2016-11-18 DIAGNOSIS — F419 Anxiety disorder, unspecified: Secondary | ICD-10-CM

## 2016-11-18 MED ORDER — TIZANIDINE HCL 2 MG PO TABS: ORAL_TABLET | ORAL | 0 refills | Status: DC

## 2016-11-18 NOTE — Progress Notes (Signed)
NEUROLOGY CONSULTATION NOTE  Eric [name] MRN: [id number] DOB: February 06, 1941  Referring provider: Dr. Forde Dandy Primary care provider: Dr. Forde Dandy  Reason for consult:  headache  HISTORY OF PRESENT ILLNESS: Eric [name] is a 76 year old right handed male with type 1 diabetes, hyperlipidemia, sleep apnea, hypertension, anxiety and history of prostate cancer who presents for headache.  History supplemented by PCP note.  Onset:  Over 2 month ago, he started to develop severe anxiety.  He attributes this to multiple health related factors.  He needed treatment for diabetes-related macular edema.  He also had a skin biopsy performed on his hand to evaluate for cancer.  He was started on using the Dexcom to monitor his glucose, which is uncontrolled.  In early May, he fell backwards out of a chair, hitting the back of his head.  He did not lose consciousness.  He says headaches started shortly after that. Location:  band-like; center forehead, back of head.  No significant neck pain. Quality:  pressure Intensity:  Initially 10/10, now 5-6/10 Aura:  no Prodrome:  no Postdrome:  no Associated symptoms:  No nausea, vomiting, photophobia, phonophobia.  Notes separate blurred vision.  He is followed by ophthalmology.  She has not had any new worse headache of her life, waking up from sleep Duration:  Occurs about 30 minutes after he wakes up and lasts all day Frequency:  daily Frequency of abortive medication: BC and ibuprofen daily Triggers/exacerbating factors:  stress, tension, fear Relieving factors:  takes glasses off (headache goes away for 1 hour and then returns) Activity:  functions  Past NSAIDS:  no Past analgesics:  no Past abortive triptans:  no Past muscle relaxants:  no Past anti-emetic:  no  Current NSAIDS:  ibuprofen Current analgesics:  BC.  Note:  Acetaminophen contraindicated as it interferes with results of Dexcom glucose monitoring. Current triptans:  no Current  anti-emetic:  no Current muscle relaxants:  no Current anti-anxiolytic:  lorazepam 0.5mg  Current sleep aide:  lorazepam Current Antihypertensive medications:  amlodipine Current Antidepressant medications:  citalopram 20mg  Current Anticonvulsant medications:  no Current Vitamins/Herbal/Supplements:  no Current Antihistamines/Decongestants:  no Other therapy:  no  Caffeine:  decaff Alcohol:  no Smoker:  no Diet:  low appetite Exercise:  Stopped since anxiety and headache started. Depression/anxiety:  Yes; low energy, fatigue, malaise, doesn't like to do things anymore, low appetite Sleep hygiene:  Okay with lorazepam No prior history of headache.  MRI of brain without contrast from 10/28/16 was personally reviewed and revealed no secondary cause for headache.  10/24/16 LABS:  CBC with WBC 6.77, HGB 16.6, HCT 48.3, PLT 178; BMP with Na 139, K 5.6, Cl 104, CO2 25, glucose 371, BUN 18, Cr 1.4; hepatic panel with total bili 0.8, ALP 106, AST 16 and ALT 16; TSH 1.02, free T4 1.2.  PAST MEDICAL HISTORY: Past Medical History:  Diagnosis Date  . Anxiety   . Diabetes mellitus    insulin pump  . Hearing problem   . History of blurred vision   . Hyperlipidemia   . Hypertension   . Impotence of organic origin   . Malignant neoplasm prostate ([place])   . Nocturia   . Right shoulder pain    "due to prior surgery"  . Shoulder injury    continues to have occ pain  . Snoring disorder    "loud " per wife.   . Stress incontinence, male   . Type I diabetes mellitus ([place])   . Ventral  hernia     PAST SURGICAL HISTORY: Past Surgical History:  Procedure Laterality Date  . CYSTOSCOPY  01/06/2012   Procedure: CYSTOSCOPY FLEXIBLE;  Surgeon: Ailene Rud, MD;  Location: [place];  Service: Urology;  Laterality: N/A;  . CYSTOSCOPY WITH URETHRAL DILATATION  01/06/2012   Procedure: CYSTOSCOPY WITH URETHRAL DILATATION;  Surgeon: Ailene Rud, MD;  Location: [place];  Service: Urology;   Laterality: N/A;  insertion of foley catheter  . EYE SURGERY  12& 07/2010   macular pucker  . HERNIA REPAIR    . PROSTATE SURGERY  02/2010  . VENTRAL HERNIA REPAIR  01/06/2012   Procedure: LAPAROSCOPIC VENTRAL HERNIA;  Surgeon: Merrie Roof, MD;  Location: [place];  Service: General;  Laterality: N/A;    MEDICATIONS: Current Outpatient Prescriptions on File Prior to Visit  Medication Sig Dispense Refill  . amLODipine (NORVASC) 5 MG tablet Take 5 mg by mouth daily.     Marland Kitchen aspirin 81 MG tablet Take 81 mg by mouth daily.    . citalopram (CELEXA) 20 MG tablet Take 20 mg by mouth daily.    . insulin aspart (NOVOLOG) 100 UNIT/ML injection Inject into the skin continuous.      No current facility-administered medications on file prior to visit.     ALLERGIES: Allergies  Allergen Reactions  . Beef-Derived Products Swelling    NO BEEF,PORK & LAMB    FAMILY HISTORY: Family History  Problem Relation Age of Onset  . Stroke Mother   . Heart disease Father   . Cancer Unknown   . Heart disease Unknown   . Diabetes Unknown     SOCIAL HISTORY: Social History   Social History  . Marital status: Married    Spouse name: N/A  . Number of children: N/A  . Years of education: N/A   Occupational History  . Not on file.   Social History Main Topics  . Smoking status: Never Smoker  . Smokeless tobacco: Not on file  . Alcohol use No  . Drug use: No  . Sexual activity: Not on file   Other Topics Concern  . Not on file   Social History Narrative  . No narrative on file    REVIEW OF SYSTEMS: Constitutional: No fevers, chills, or sweats, no generalized fatigue, change in appetite Eyes: No visual changes, double vision, eye pain Ear, nose and throat: No hearing loss, ear pain, nasal congestion, sore throat Cardiovascular: No chest pain, palpitations Respiratory:  No shortness of breath at rest or with exertion, wheezes GastrointestinaI: No nausea, vomiting, diarrhea, abdominal  pain, fecal incontinence Genitourinary:  No dysuria, urinary retention or frequency Musculoskeletal:  No neck pain, back pain Integumentary: No rash, pruritus, skin lesions Neurological: as above Psychiatric: No depression, insomnia, anxiety Endocrine: No palpitations, fatigue, diaphoresis, mood swings, change in appetite, change in weight, increased thirst Hematologic/Lymphatic:  No purpura, petechiae. Allergic/Immunologic: no itchy/runny eyes, nasal congestion, recent allergic reactions, rashes  PHYSICAL EXAM: Vitals:   11/18/16 0951  BP: (!) 150/70  Pulse: 75   General: very anxious  Patient appears well-groomed.  Head:  Normocephalic/atraumatic Eyes:  fundi examined but not visualized Neck: supple, no paraspinal tenderness, full range of motion Back: No paraspinal tenderness Heart: regular rate and rhythm Lungs: Clear to auscultation bilaterally. Vascular: No carotid bruits. Neurological Exam: Mental status: alert and oriented to person, place, and time, recent and remote memory intact, fund of knowledge intact, attention and concentration intact, speech fluent and not dysarthric, language intact.  Cranial nerves: CN I: not tested CN II: pupils equal, round and reactive to light, visual fields intact CN III, IV, VI:  full range of motion, no nystagmus, no ptosis CN V: facial sensation intact CN VII: upper and lower face symmetric CN VIII: hearing intact CN IX, X: gag intact, uvula midline CN XI: sternocleidomastoid and trapezius muscles intact CN XII: tongue midline Bulk & Tone: normal, no fasciculations. Motor:  5/5 throughout  Sensation: temperature and vibration sensation intact. Deep Tendon Reflexes:  2+ throughout, toes downgoing.  Finger to nose testing:  Without dysmetria.  Heel to shin:  Without dysmetria.  Gait:  Normal station and stride.  Able to turn and tandem walk. Romberg negative.  IMPRESSION: Tension-type headache, triggered by anxiety and complicated  by medication overuse. Hypertension  PLAN: 1.  Initiate tizanidine taper to goal of 4mg  three times daily.  Caution for drowsiness. 2.  Refer for cognitive behavioral therapy 3.  Advised to limit pain relievers to no more than 2 days out of the week  4.  Advised to start exercising again 5.  On Celexa.  Must aggressively treat anxiety, which is the trigger. 6.  Follow up blood pressure with PCP (likely elevation related to anxiety) 7.  Follow up in 3 months but he will contact me for any necessary refills of tizanidine or medication adjustment.  Thank you for allowing me to take part in the care of this patient.  Metta Clines, DO  CC: Reynold Bowen, MD

## 2016-11-18 NOTE — Patient Instructions (Signed)
1.  Start tizanidine 2mg  tablets (a muscle relaxant):   Take 1 tablet at bedtime for 7 days   Then 1 tablet twice daily for 7 days   Then 1 tablet three times daily for 7 days   Then 2 tablets three times daily   Contact me for refill 2.  We will refer you for cognitive behavioral therapy 3.  Limit use of ibuprofen or BC to no more than 2 days out of the week to prevent rebound headache 4.  Start exercising again. 5.  Follow up in 3 months

## 2016-11-24 DIAGNOSIS — M542 Cervicalgia: Secondary | ICD-10-CM | POA: Diagnosis not present

## 2016-11-24 DIAGNOSIS — M9901 Segmental and somatic dysfunction of cervical region: Secondary | ICD-10-CM | POA: Diagnosis not present

## 2016-11-24 DIAGNOSIS — M9902 Segmental and somatic dysfunction of thoracic region: Secondary | ICD-10-CM | POA: Diagnosis not present

## 2016-11-28 DIAGNOSIS — R0682 Tachypnea, not elsewhere classified: Secondary | ICD-10-CM | POA: Diagnosis not present

## 2016-11-28 DIAGNOSIS — R51 Headache: Secondary | ICD-10-CM | POA: Diagnosis not present

## 2016-11-28 DIAGNOSIS — R29818 Other symptoms and signs involving the nervous system: Secondary | ICD-10-CM | POA: Diagnosis not present

## 2016-11-28 DIAGNOSIS — G4489 Other headache syndrome: Secondary | ICD-10-CM | POA: Diagnosis not present

## 2016-11-28 DIAGNOSIS — R404 Transient alteration of awareness: Secondary | ICD-10-CM | POA: Diagnosis not present

## 2016-11-28 DIAGNOSIS — R42 Dizziness and giddiness: Secondary | ICD-10-CM | POA: Diagnosis not present

## 2016-12-15 ENCOUNTER — Other Ambulatory Visit: Payer: Self-pay | Admitting: Neurology

## 2016-12-25 DIAGNOSIS — R413 Other amnesia: Secondary | ICD-10-CM | POA: Diagnosis not present

## 2016-12-25 DIAGNOSIS — R251 Tremor, unspecified: Secondary | ICD-10-CM | POA: Diagnosis not present

## 2016-12-25 DIAGNOSIS — F419 Anxiety disorder, unspecified: Secondary | ICD-10-CM | POA: Diagnosis not present

## 2016-12-25 DIAGNOSIS — R4182 Altered mental status, unspecified: Secondary | ICD-10-CM | POA: Diagnosis not present

## 2016-12-25 DIAGNOSIS — Z0000 Encounter for general adult medical examination without abnormal findings: Secondary | ICD-10-CM | POA: Diagnosis not present

## 2016-12-25 DIAGNOSIS — M6281 Muscle weakness (generalized): Secondary | ICD-10-CM | POA: Diagnosis not present

## 2016-12-25 DIAGNOSIS — R2689 Other abnormalities of gait and mobility: Secondary | ICD-10-CM | POA: Diagnosis not present

## 2016-12-25 DIAGNOSIS — R51 Headache: Secondary | ICD-10-CM | POA: Diagnosis not present

## 2016-12-31 DIAGNOSIS — Z4681 Encounter for fitting and adjustment of insulin pump: Secondary | ICD-10-CM | POA: Diagnosis not present

## 2016-12-31 DIAGNOSIS — I10 Essential (primary) hypertension: Secondary | ICD-10-CM | POA: Diagnosis not present

## 2016-12-31 DIAGNOSIS — E1029 Type 1 diabetes mellitus with other diabetic kidney complication: Secondary | ICD-10-CM | POA: Diagnosis not present

## 2016-12-31 DIAGNOSIS — R51 Headache: Secondary | ICD-10-CM | POA: Diagnosis not present

## 2016-12-31 DIAGNOSIS — Z6825 Body mass index (BMI) 25.0-25.9, adult: Secondary | ICD-10-CM | POA: Diagnosis not present

## 2017-01-06 DIAGNOSIS — R251 Tremor, unspecified: Secondary | ICD-10-CM | POA: Diagnosis not present

## 2017-01-06 DIAGNOSIS — R2689 Other abnormalities of gait and mobility: Secondary | ICD-10-CM | POA: Diagnosis not present

## 2017-01-06 DIAGNOSIS — R413 Other amnesia: Secondary | ICD-10-CM | POA: Diagnosis not present

## 2017-01-06 DIAGNOSIS — G4733 Obstructive sleep apnea (adult) (pediatric): Secondary | ICD-10-CM | POA: Diagnosis not present

## 2017-01-06 DIAGNOSIS — M6281 Muscle weakness (generalized): Secondary | ICD-10-CM | POA: Diagnosis not present

## 2017-01-06 DIAGNOSIS — R51 Headache: Secondary | ICD-10-CM | POA: Diagnosis not present

## 2017-01-06 DIAGNOSIS — F419 Anxiety disorder, unspecified: Secondary | ICD-10-CM | POA: Diagnosis not present

## 2017-01-14 ENCOUNTER — Other Ambulatory Visit: Payer: Self-pay | Admitting: Neurology

## 2017-01-14 DIAGNOSIS — F419 Anxiety disorder, unspecified: Secondary | ICD-10-CM | POA: Diagnosis not present

## 2017-01-14 DIAGNOSIS — R2689 Other abnormalities of gait and mobility: Secondary | ICD-10-CM | POA: Diagnosis not present

## 2017-01-14 DIAGNOSIS — R251 Tremor, unspecified: Secondary | ICD-10-CM | POA: Diagnosis not present

## 2017-01-14 DIAGNOSIS — R4182 Altered mental status, unspecified: Secondary | ICD-10-CM | POA: Diagnosis not present

## 2017-01-14 DIAGNOSIS — R9082 White matter disease, unspecified: Secondary | ICD-10-CM | POA: Diagnosis not present

## 2017-01-14 DIAGNOSIS — R51 Headache: Secondary | ICD-10-CM | POA: Diagnosis not present

## 2017-01-14 DIAGNOSIS — R413 Other amnesia: Secondary | ICD-10-CM | POA: Diagnosis not present

## 2017-01-22 DIAGNOSIS — M6281 Muscle weakness (generalized): Secondary | ICD-10-CM | POA: Diagnosis not present

## 2017-01-22 DIAGNOSIS — R2689 Other abnormalities of gait and mobility: Secondary | ICD-10-CM | POA: Diagnosis not present

## 2017-01-22 DIAGNOSIS — R51 Headache: Secondary | ICD-10-CM | POA: Diagnosis not present

## 2017-01-22 DIAGNOSIS — G4733 Obstructive sleep apnea (adult) (pediatric): Secondary | ICD-10-CM | POA: Diagnosis not present

## 2017-01-22 DIAGNOSIS — R251 Tremor, unspecified: Secondary | ICD-10-CM | POA: Diagnosis not present

## 2017-01-22 DIAGNOSIS — R413 Other amnesia: Secondary | ICD-10-CM | POA: Diagnosis not present

## 2017-01-22 DIAGNOSIS — F419 Anxiety disorder, unspecified: Secondary | ICD-10-CM | POA: Diagnosis not present

## 2017-01-23 DIAGNOSIS — F419 Anxiety disorder, unspecified: Secondary | ICD-10-CM | POA: Diagnosis not present

## 2017-01-23 DIAGNOSIS — R251 Tremor, unspecified: Secondary | ICD-10-CM | POA: Diagnosis not present

## 2017-01-23 DIAGNOSIS — R410 Disorientation, unspecified: Secondary | ICD-10-CM | POA: Diagnosis not present

## 2017-01-27 DIAGNOSIS — R251 Tremor, unspecified: Secondary | ICD-10-CM | POA: Diagnosis not present

## 2017-01-27 DIAGNOSIS — R419 Unspecified symptoms and signs involving cognitive functions and awareness: Secondary | ICD-10-CM | POA: Diagnosis not present

## 2017-02-11 DIAGNOSIS — Z794 Long term (current) use of insulin: Secondary | ICD-10-CM | POA: Diagnosis not present

## 2017-02-11 DIAGNOSIS — Z6824 Body mass index (BMI) 24.0-24.9, adult: Secondary | ICD-10-CM | POA: Diagnosis not present

## 2017-02-11 DIAGNOSIS — Z23 Encounter for immunization: Secondary | ICD-10-CM | POA: Diagnosis not present

## 2017-02-11 DIAGNOSIS — Z4681 Encounter for fitting and adjustment of insulin pump: Secondary | ICD-10-CM | POA: Diagnosis not present

## 2017-02-11 DIAGNOSIS — E1029 Type 1 diabetes mellitus with other diabetic kidney complication: Secondary | ICD-10-CM | POA: Diagnosis not present

## 2017-02-11 DIAGNOSIS — I10 Essential (primary) hypertension: Secondary | ICD-10-CM | POA: Diagnosis not present

## 2017-02-16 DIAGNOSIS — R51 Headache: Secondary | ICD-10-CM | POA: Diagnosis not present

## 2017-02-16 DIAGNOSIS — R410 Disorientation, unspecified: Secondary | ICD-10-CM | POA: Diagnosis not present

## 2017-02-16 DIAGNOSIS — F419 Anxiety disorder, unspecified: Secondary | ICD-10-CM | POA: Diagnosis not present

## 2017-02-16 DIAGNOSIS — R413 Other amnesia: Secondary | ICD-10-CM | POA: Diagnosis not present

## 2017-02-16 DIAGNOSIS — M6281 Muscle weakness (generalized): Secondary | ICD-10-CM | POA: Diagnosis not present

## 2017-02-16 DIAGNOSIS — R251 Tremor, unspecified: Secondary | ICD-10-CM | POA: Diagnosis not present

## 2017-02-16 DIAGNOSIS — G4733 Obstructive sleep apnea (adult) (pediatric): Secondary | ICD-10-CM | POA: Diagnosis not present

## 2017-02-16 DIAGNOSIS — R2689 Other abnormalities of gait and mobility: Secondary | ICD-10-CM | POA: Diagnosis not present

## 2017-02-25 ENCOUNTER — Ambulatory Visit: Payer: Medicare Other | Admitting: Neurology

## 2017-02-25 DIAGNOSIS — H43811 Vitreous degeneration, right eye: Secondary | ICD-10-CM | POA: Diagnosis not present

## 2017-02-25 DIAGNOSIS — E113291 Type 2 diabetes mellitus with mild nonproliferative diabetic retinopathy without macular edema, right eye: Secondary | ICD-10-CM | POA: Diagnosis not present

## 2017-02-25 DIAGNOSIS — E103312 Type 1 diabetes mellitus with moderate nonproliferative diabetic retinopathy with macular edema, left eye: Secondary | ICD-10-CM | POA: Diagnosis not present

## 2017-03-03 DIAGNOSIS — M6281 Muscle weakness (generalized): Secondary | ICD-10-CM | POA: Diagnosis not present

## 2017-03-03 DIAGNOSIS — R2689 Other abnormalities of gait and mobility: Secondary | ICD-10-CM | POA: Diagnosis not present

## 2017-03-10 DIAGNOSIS — R2689 Other abnormalities of gait and mobility: Secondary | ICD-10-CM | POA: Diagnosis not present

## 2017-03-10 DIAGNOSIS — M6281 Muscle weakness (generalized): Secondary | ICD-10-CM | POA: Diagnosis not present

## 2017-03-17 DIAGNOSIS — R2689 Other abnormalities of gait and mobility: Secondary | ICD-10-CM | POA: Diagnosis not present

## 2017-03-17 DIAGNOSIS — M6281 Muscle weakness (generalized): Secondary | ICD-10-CM | POA: Diagnosis not present

## 2017-03-18 DIAGNOSIS — E103312 Type 1 diabetes mellitus with moderate nonproliferative diabetic retinopathy with macular edema, left eye: Secondary | ICD-10-CM | POA: Diagnosis not present

## 2017-03-18 DIAGNOSIS — E113291 Type 2 diabetes mellitus with mild nonproliferative diabetic retinopathy without macular edema, right eye: Secondary | ICD-10-CM | POA: Diagnosis not present

## 2017-03-20 DIAGNOSIS — G4733 Obstructive sleep apnea (adult) (pediatric): Secondary | ICD-10-CM | POA: Diagnosis not present

## 2017-03-24 DIAGNOSIS — M6281 Muscle weakness (generalized): Secondary | ICD-10-CM | POA: Diagnosis not present

## 2017-03-24 DIAGNOSIS — R2689 Other abnormalities of gait and mobility: Secondary | ICD-10-CM | POA: Diagnosis not present

## 2017-03-31 DIAGNOSIS — R2689 Other abnormalities of gait and mobility: Secondary | ICD-10-CM | POA: Diagnosis not present

## 2017-03-31 DIAGNOSIS — M6281 Muscle weakness (generalized): Secondary | ICD-10-CM | POA: Diagnosis not present

## 2017-04-08 DIAGNOSIS — E103212 Type 1 diabetes mellitus with mild nonproliferative diabetic retinopathy with macular edema, left eye: Secondary | ICD-10-CM | POA: Diagnosis not present

## 2017-04-08 DIAGNOSIS — H43811 Vitreous degeneration, right eye: Secondary | ICD-10-CM | POA: Diagnosis not present

## 2017-04-08 DIAGNOSIS — E103291 Type 1 diabetes mellitus with mild nonproliferative diabetic retinopathy without macular edema, right eye: Secondary | ICD-10-CM | POA: Diagnosis not present

## 2017-04-09 DIAGNOSIS — R251 Tremor, unspecified: Secondary | ICD-10-CM | POA: Diagnosis not present

## 2017-04-09 DIAGNOSIS — R410 Disorientation, unspecified: Secondary | ICD-10-CM | POA: Diagnosis not present

## 2017-04-09 DIAGNOSIS — R413 Other amnesia: Secondary | ICD-10-CM | POA: Diagnosis not present

## 2017-04-09 DIAGNOSIS — M6281 Muscle weakness (generalized): Secondary | ICD-10-CM | POA: Diagnosis not present

## 2017-04-09 DIAGNOSIS — R2689 Other abnormalities of gait and mobility: Secondary | ICD-10-CM | POA: Diagnosis not present

## 2017-04-09 DIAGNOSIS — R51 Headache: Secondary | ICD-10-CM | POA: Diagnosis not present

## 2017-04-09 DIAGNOSIS — G4733 Obstructive sleep apnea (adult) (pediatric): Secondary | ICD-10-CM | POA: Diagnosis not present

## 2017-04-09 DIAGNOSIS — F419 Anxiety disorder, unspecified: Secondary | ICD-10-CM | POA: Diagnosis not present

## 2017-04-10 DIAGNOSIS — G4733 Obstructive sleep apnea (adult) (pediatric): Secondary | ICD-10-CM | POA: Diagnosis not present

## 2017-05-11 DIAGNOSIS — C61 Malignant neoplasm of prostate: Secondary | ICD-10-CM | POA: Diagnosis not present

## 2017-05-11 DIAGNOSIS — N393 Stress incontinence (female) (male): Secondary | ICD-10-CM | POA: Diagnosis not present

## 2017-05-11 DIAGNOSIS — N35014 Post-traumatic urethral stricture, male, unspecified: Secondary | ICD-10-CM | POA: Diagnosis not present

## 2017-05-11 DIAGNOSIS — N528 Other male erectile dysfunction: Secondary | ICD-10-CM | POA: Diagnosis not present

## 2017-05-11 DIAGNOSIS — Z8546 Personal history of malignant neoplasm of prostate: Secondary | ICD-10-CM | POA: Diagnosis not present

## 2017-05-12 DIAGNOSIS — Z794 Long term (current) use of insulin: Secondary | ICD-10-CM | POA: Diagnosis not present

## 2017-05-12 DIAGNOSIS — E1029 Type 1 diabetes mellitus with other diabetic kidney complication: Secondary | ICD-10-CM | POA: Diagnosis not present

## 2017-05-12 DIAGNOSIS — N08 Glomerular disorders in diseases classified elsewhere: Secondary | ICD-10-CM | POA: Diagnosis not present

## 2017-05-12 DIAGNOSIS — Z4681 Encounter for fitting and adjustment of insulin pump: Secondary | ICD-10-CM | POA: Diagnosis not present

## 2017-05-12 DIAGNOSIS — Z23 Encounter for immunization: Secondary | ICD-10-CM | POA: Diagnosis not present

## 2017-05-12 DIAGNOSIS — I10 Essential (primary) hypertension: Secondary | ICD-10-CM | POA: Diagnosis not present

## 2017-07-02 DIAGNOSIS — G4733 Obstructive sleep apnea (adult) (pediatric): Secondary | ICD-10-CM | POA: Diagnosis not present

## 2017-08-11 DIAGNOSIS — N08 Glomerular disorders in diseases classified elsewhere: Secondary | ICD-10-CM | POA: Diagnosis not present

## 2017-08-11 DIAGNOSIS — Z4681 Encounter for fitting and adjustment of insulin pump: Secondary | ICD-10-CM | POA: Diagnosis not present

## 2017-08-11 DIAGNOSIS — I10 Essential (primary) hypertension: Secondary | ICD-10-CM | POA: Diagnosis not present

## 2017-08-11 DIAGNOSIS — E1029 Type 1 diabetes mellitus with other diabetic kidney complication: Secondary | ICD-10-CM | POA: Diagnosis not present

## 2017-08-11 DIAGNOSIS — Z794 Long term (current) use of insulin: Secondary | ICD-10-CM | POA: Diagnosis not present

## 2017-08-11 DIAGNOSIS — Z6828 Body mass index (BMI) 28.0-28.9, adult: Secondary | ICD-10-CM | POA: Diagnosis not present

## 2017-09-04 DIAGNOSIS — K760 Fatty (change of) liver, not elsewhere classified: Secondary | ICD-10-CM | POA: Diagnosis not present

## 2017-09-04 DIAGNOSIS — E7849 Other hyperlipidemia: Secondary | ICD-10-CM | POA: Diagnosis not present

## 2017-09-04 DIAGNOSIS — E559 Vitamin D deficiency, unspecified: Secondary | ICD-10-CM | POA: Diagnosis not present

## 2017-09-04 DIAGNOSIS — E1169 Type 2 diabetes mellitus with other specified complication: Secondary | ICD-10-CM | POA: Diagnosis not present

## 2017-09-04 DIAGNOSIS — K219 Gastro-esophageal reflux disease without esophagitis: Secondary | ICD-10-CM | POA: Diagnosis not present

## 2017-09-04 DIAGNOSIS — J45909 Unspecified asthma, uncomplicated: Secondary | ICD-10-CM | POA: Diagnosis not present

## 2017-09-04 DIAGNOSIS — Z6841 Body Mass Index (BMI) 40.0 and over, adult: Secondary | ICD-10-CM | POA: Diagnosis not present

## 2017-09-04 DIAGNOSIS — E042 Nontoxic multinodular goiter: Secondary | ICD-10-CM | POA: Diagnosis not present

## 2017-09-04 DIAGNOSIS — I10 Essential (primary) hypertension: Secondary | ICD-10-CM | POA: Diagnosis not present

## 2017-09-04 DIAGNOSIS — M722 Plantar fascial fibromatosis: Secondary | ICD-10-CM | POA: Diagnosis not present

## 2017-10-02 DIAGNOSIS — Z0000 Encounter for general adult medical examination without abnormal findings: Secondary | ICD-10-CM | POA: Diagnosis not present

## 2017-10-13 DIAGNOSIS — M6281 Muscle weakness (generalized): Secondary | ICD-10-CM | POA: Diagnosis not present

## 2017-10-13 DIAGNOSIS — R410 Disorientation, unspecified: Secondary | ICD-10-CM | POA: Diagnosis not present

## 2017-10-13 DIAGNOSIS — R413 Other amnesia: Secondary | ICD-10-CM | POA: Diagnosis not present

## 2017-10-13 DIAGNOSIS — R51 Headache: Secondary | ICD-10-CM | POA: Diagnosis not present

## 2017-10-13 DIAGNOSIS — G4733 Obstructive sleep apnea (adult) (pediatric): Secondary | ICD-10-CM | POA: Diagnosis not present

## 2017-10-13 DIAGNOSIS — F419 Anxiety disorder, unspecified: Secondary | ICD-10-CM | POA: Diagnosis not present

## 2017-10-13 DIAGNOSIS — R2689 Other abnormalities of gait and mobility: Secondary | ICD-10-CM | POA: Diagnosis not present

## 2017-10-13 DIAGNOSIS — R251 Tremor, unspecified: Secondary | ICD-10-CM | POA: Diagnosis not present

## 2017-10-14 DIAGNOSIS — E103212 Type 1 diabetes mellitus with mild nonproliferative diabetic retinopathy with macular edema, left eye: Secondary | ICD-10-CM | POA: Diagnosis not present

## 2017-10-14 DIAGNOSIS — E103291 Type 1 diabetes mellitus with mild nonproliferative diabetic retinopathy without macular edema, right eye: Secondary | ICD-10-CM | POA: Diagnosis not present

## 2017-10-14 DIAGNOSIS — H43811 Vitreous degeneration, right eye: Secondary | ICD-10-CM | POA: Diagnosis not present

## 2017-11-03 DIAGNOSIS — C44612 Basal cell carcinoma of skin of right upper limb, including shoulder: Secondary | ICD-10-CM | POA: Diagnosis not present

## 2017-11-03 DIAGNOSIS — C44319 Basal cell carcinoma of skin of other parts of face: Secondary | ICD-10-CM | POA: Diagnosis not present

## 2017-11-03 DIAGNOSIS — C4441 Basal cell carcinoma of skin of scalp and neck: Secondary | ICD-10-CM | POA: Diagnosis not present

## 2017-11-03 DIAGNOSIS — L820 Inflamed seborrheic keratosis: Secondary | ICD-10-CM | POA: Diagnosis not present

## 2017-11-03 DIAGNOSIS — L821 Other seborrheic keratosis: Secondary | ICD-10-CM | POA: Diagnosis not present

## 2017-11-03 DIAGNOSIS — D485 Neoplasm of uncertain behavior of skin: Secondary | ICD-10-CM | POA: Diagnosis not present

## 2017-11-03 DIAGNOSIS — C44712 Basal cell carcinoma of skin of right lower limb, including hip: Secondary | ICD-10-CM | POA: Diagnosis not present

## 2017-11-03 DIAGNOSIS — Z85828 Personal history of other malignant neoplasm of skin: Secondary | ICD-10-CM | POA: Diagnosis not present

## 2017-11-03 DIAGNOSIS — D225 Melanocytic nevi of trunk: Secondary | ICD-10-CM | POA: Diagnosis not present

## 2017-11-03 DIAGNOSIS — L814 Other melanin hyperpigmentation: Secondary | ICD-10-CM | POA: Diagnosis not present

## 2017-11-03 DIAGNOSIS — L570 Actinic keratosis: Secondary | ICD-10-CM | POA: Diagnosis not present

## 2017-11-11 DIAGNOSIS — Z4681 Encounter for fitting and adjustment of insulin pump: Secondary | ICD-10-CM | POA: Diagnosis not present

## 2017-11-11 DIAGNOSIS — Z85828 Personal history of other malignant neoplasm of skin: Secondary | ICD-10-CM | POA: Diagnosis not present

## 2017-11-11 DIAGNOSIS — C44319 Basal cell carcinoma of skin of other parts of face: Secondary | ICD-10-CM | POA: Diagnosis not present

## 2017-11-11 DIAGNOSIS — I10 Essential (primary) hypertension: Secondary | ICD-10-CM | POA: Diagnosis not present

## 2017-11-11 DIAGNOSIS — N183 Chronic kidney disease, stage 3 (moderate): Secondary | ICD-10-CM | POA: Diagnosis not present

## 2017-11-11 DIAGNOSIS — Z6828 Body mass index (BMI) 28.0-28.9, adult: Secondary | ICD-10-CM | POA: Diagnosis not present

## 2017-11-11 DIAGNOSIS — Z794 Long term (current) use of insulin: Secondary | ICD-10-CM | POA: Diagnosis not present

## 2017-11-11 DIAGNOSIS — E1029 Type 1 diabetes mellitus with other diabetic kidney complication: Secondary | ICD-10-CM | POA: Diagnosis not present

## 2017-12-19 DIAGNOSIS — J069 Acute upper respiratory infection, unspecified: Secondary | ICD-10-CM | POA: Diagnosis not present

## 2018-01-12 DIAGNOSIS — I6529 Occlusion and stenosis of unspecified carotid artery: Secondary | ICD-10-CM | POA: Diagnosis not present

## 2018-01-12 DIAGNOSIS — C61 Malignant neoplasm of prostate: Secondary | ICD-10-CM | POA: Diagnosis not present

## 2018-01-12 DIAGNOSIS — Z6828 Body mass index (BMI) 28.0-28.9, adult: Secondary | ICD-10-CM | POA: Diagnosis not present

## 2018-01-12 DIAGNOSIS — E1029 Type 1 diabetes mellitus with other diabetic kidney complication: Secondary | ICD-10-CM | POA: Diagnosis not present

## 2018-01-12 DIAGNOSIS — Z794 Long term (current) use of insulin: Secondary | ICD-10-CM | POA: Diagnosis not present

## 2018-01-12 DIAGNOSIS — I739 Peripheral vascular disease, unspecified: Secondary | ICD-10-CM | POA: Diagnosis not present

## 2018-01-12 DIAGNOSIS — E11311 Type 2 diabetes mellitus with unspecified diabetic retinopathy with macular edema: Secondary | ICD-10-CM | POA: Diagnosis not present

## 2018-01-12 DIAGNOSIS — K635 Polyp of colon: Secondary | ICD-10-CM | POA: Diagnosis not present

## 2018-01-12 DIAGNOSIS — F3289 Other specified depressive episodes: Secondary | ICD-10-CM | POA: Diagnosis not present

## 2018-01-12 DIAGNOSIS — N183 Chronic kidney disease, stage 3 (moderate): Secondary | ICD-10-CM | POA: Diagnosis not present

## 2018-01-12 DIAGNOSIS — I10 Essential (primary) hypertension: Secondary | ICD-10-CM | POA: Diagnosis not present

## 2018-01-12 DIAGNOSIS — R51 Headache: Secondary | ICD-10-CM | POA: Diagnosis not present

## 2018-02-09 DIAGNOSIS — J392 Other diseases of pharynx: Secondary | ICD-10-CM | POA: Diagnosis not present

## 2018-02-09 DIAGNOSIS — K219 Gastro-esophageal reflux disease without esophagitis: Secondary | ICD-10-CM | POA: Diagnosis not present

## 2018-02-09 DIAGNOSIS — J387 Other diseases of larynx: Secondary | ICD-10-CM | POA: Diagnosis not present

## 2018-02-10 DIAGNOSIS — H43811 Vitreous degeneration, right eye: Secondary | ICD-10-CM | POA: Diagnosis not present

## 2018-02-10 DIAGNOSIS — E103291 Type 1 diabetes mellitus with mild nonproliferative diabetic retinopathy without macular edema, right eye: Secondary | ICD-10-CM | POA: Diagnosis not present

## 2018-02-10 DIAGNOSIS — E103212 Type 1 diabetes mellitus with mild nonproliferative diabetic retinopathy with macular edema, left eye: Secondary | ICD-10-CM | POA: Diagnosis not present

## 2018-02-23 DIAGNOSIS — E1029 Type 1 diabetes mellitus with other diabetic kidney complication: Secondary | ICD-10-CM | POA: Diagnosis not present

## 2018-02-23 DIAGNOSIS — I10 Essential (primary) hypertension: Secondary | ICD-10-CM | POA: Diagnosis not present

## 2018-02-23 DIAGNOSIS — Z794 Long term (current) use of insulin: Secondary | ICD-10-CM | POA: Diagnosis not present

## 2018-02-23 DIAGNOSIS — Z23 Encounter for immunization: Secondary | ICD-10-CM | POA: Diagnosis not present

## 2018-02-23 DIAGNOSIS — Z4681 Encounter for fitting and adjustment of insulin pump: Secondary | ICD-10-CM | POA: Diagnosis not present

## 2018-02-23 DIAGNOSIS — N183 Chronic kidney disease, stage 3 (moderate): Secondary | ICD-10-CM | POA: Diagnosis not present

## 2018-03-23 DIAGNOSIS — Z6828 Body mass index (BMI) 28.0-28.9, adult: Secondary | ICD-10-CM | POA: Diagnosis not present

## 2018-03-23 DIAGNOSIS — E1029 Type 1 diabetes mellitus with other diabetic kidney complication: Secondary | ICD-10-CM | POA: Diagnosis not present

## 2018-03-23 DIAGNOSIS — Z794 Long term (current) use of insulin: Secondary | ICD-10-CM | POA: Diagnosis not present

## 2018-04-21 DIAGNOSIS — H409 Unspecified glaucoma: Secondary | ICD-10-CM | POA: Diagnosis not present

## 2018-04-21 DIAGNOSIS — E7849 Other hyperlipidemia: Secondary | ICD-10-CM | POA: Diagnosis not present

## 2018-04-21 DIAGNOSIS — F329 Major depressive disorder, single episode, unspecified: Secondary | ICD-10-CM | POA: Diagnosis not present

## 2018-04-21 DIAGNOSIS — Z794 Long term (current) use of insulin: Secondary | ICD-10-CM | POA: Diagnosis not present

## 2018-04-21 DIAGNOSIS — F411 Generalized anxiety disorder: Secondary | ICD-10-CM | POA: Diagnosis not present

## 2018-04-21 DIAGNOSIS — I739 Peripheral vascular disease, unspecified: Secondary | ICD-10-CM | POA: Diagnosis not present

## 2018-04-21 DIAGNOSIS — R51 Headache: Secondary | ICD-10-CM | POA: Diagnosis not present

## 2018-04-21 DIAGNOSIS — E1029 Type 1 diabetes mellitus with other diabetic kidney complication: Secondary | ICD-10-CM | POA: Diagnosis not present

## 2018-04-21 DIAGNOSIS — E11311 Type 2 diabetes mellitus with unspecified diabetic retinopathy with macular edema: Secondary | ICD-10-CM | POA: Diagnosis not present

## 2018-04-21 DIAGNOSIS — Z6828 Body mass index (BMI) 28.0-28.9, adult: Secondary | ICD-10-CM | POA: Diagnosis not present

## 2018-04-21 DIAGNOSIS — I6529 Occlusion and stenosis of unspecified carotid artery: Secondary | ICD-10-CM | POA: Diagnosis not present

## 2018-04-21 DIAGNOSIS — N183 Chronic kidney disease, stage 3 (moderate): Secondary | ICD-10-CM | POA: Diagnosis not present

## 2018-04-27 DIAGNOSIS — R251 Tremor, unspecified: Secondary | ICD-10-CM | POA: Diagnosis not present

## 2018-04-27 DIAGNOSIS — R51 Headache: Secondary | ICD-10-CM | POA: Diagnosis not present

## 2018-04-27 DIAGNOSIS — R2689 Other abnormalities of gait and mobility: Secondary | ICD-10-CM | POA: Diagnosis not present

## 2018-04-27 DIAGNOSIS — R410 Disorientation, unspecified: Secondary | ICD-10-CM | POA: Diagnosis not present

## 2018-04-27 DIAGNOSIS — M6281 Muscle weakness (generalized): Secondary | ICD-10-CM | POA: Diagnosis not present

## 2018-04-27 DIAGNOSIS — R413 Other amnesia: Secondary | ICD-10-CM | POA: Diagnosis not present

## 2018-04-27 DIAGNOSIS — G4733 Obstructive sleep apnea (adult) (pediatric): Secondary | ICD-10-CM | POA: Diagnosis not present

## 2018-04-27 DIAGNOSIS — F419 Anxiety disorder, unspecified: Secondary | ICD-10-CM | POA: Diagnosis not present

## 2018-05-03 DIAGNOSIS — D1801 Hemangioma of skin and subcutaneous tissue: Secondary | ICD-10-CM | POA: Diagnosis not present

## 2018-05-03 DIAGNOSIS — D225 Melanocytic nevi of trunk: Secondary | ICD-10-CM | POA: Diagnosis not present

## 2018-05-03 DIAGNOSIS — C44319 Basal cell carcinoma of skin of other parts of face: Secondary | ICD-10-CM | POA: Diagnosis not present

## 2018-05-03 DIAGNOSIS — Z85828 Personal history of other malignant neoplasm of skin: Secondary | ICD-10-CM | POA: Diagnosis not present

## 2018-05-03 DIAGNOSIS — L570 Actinic keratosis: Secondary | ICD-10-CM | POA: Diagnosis not present

## 2018-05-03 DIAGNOSIS — C4441 Basal cell carcinoma of skin of scalp and neck: Secondary | ICD-10-CM | POA: Diagnosis not present

## 2018-05-03 DIAGNOSIS — C4401 Basal cell carcinoma of skin of lip: Secondary | ICD-10-CM | POA: Diagnosis not present

## 2018-05-03 DIAGNOSIS — L821 Other seborrheic keratosis: Secondary | ICD-10-CM | POA: Diagnosis not present

## 2019-02-23 IMAGING — MR MR HEAD W/O CM
8 series · 48 of 48 positions shown · non-contrast
Comparison: None.

CLINICAL DATA: Backwards fall out of a chair, occiput impact, 3
weeks ago. Tightness in forehead. Daily headaches. History of
prostate cancer.

EXAM:
MRI HEAD WITHOUT CONTRAST
TECHNIQUE: Multiplanar, multiecho pulse sequences of the brain and surrounding
structures were obtained without intravenous contrast.

[Series 3: T1 · sagittal · 5.0mm · 0.45mm/px · 3 of 21 slices shown]
[im 1/21]
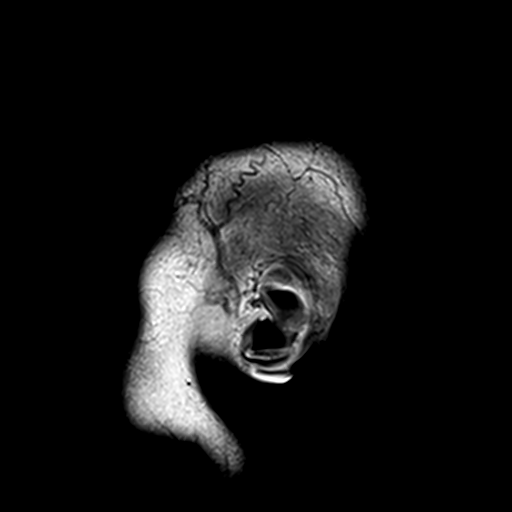
[im 11/21]
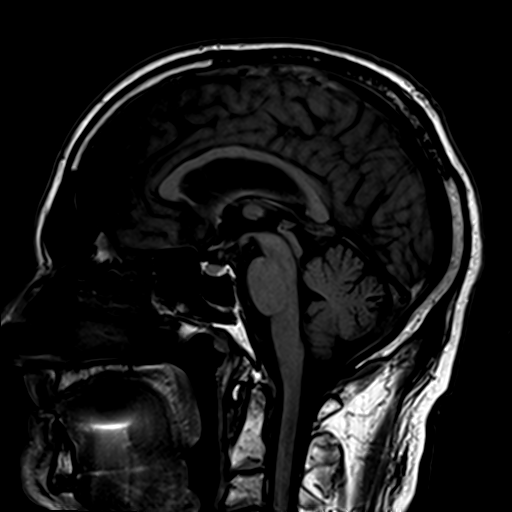
[im 21/21]
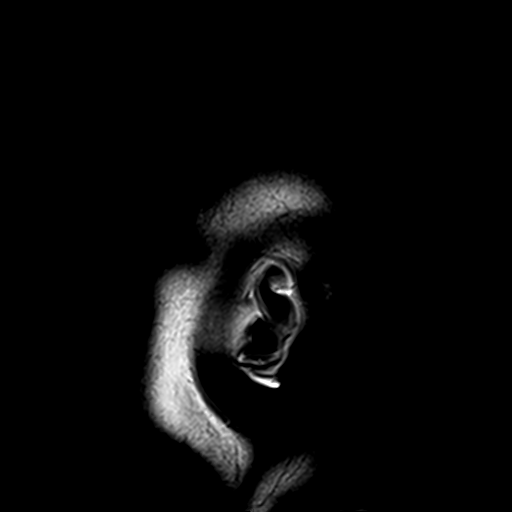

[Series 4: DWI · axial · 3.0mm · 1.80mm/px · z∈[-39,+107]mm · 10 of 100 slices shown (1 of 2)]
[im 1/100]
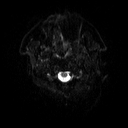
[im 12/100]
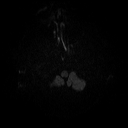
[im 23/100]
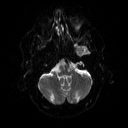
[im 34/100]
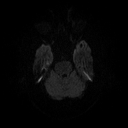
[im 45/100]
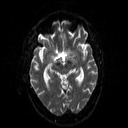
[im 56/100]
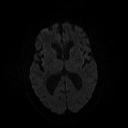
[im 67/100]
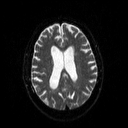
[im 78/100]
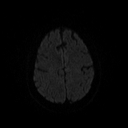
[im 89/100]
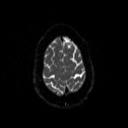
[im 100/100]
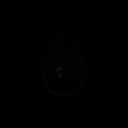

[Series 5: DWI · axial · 3.0mm · 1.80mm/px · z∈[-39,+107]mm · 5 of 50 slices shown (2 of 2)]
[im 1/50]
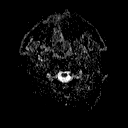
[im 13/50]
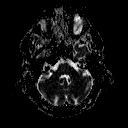
[im 25/50]
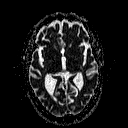
[im 37/50]
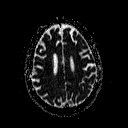
[im 50/50]
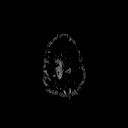

[Series 6: T2 · axial · 5.0mm · 0.60mm/px · z∈[-36,+110]mm · 2 of 22 slices shown (1 of 2)]
[im 1/22]
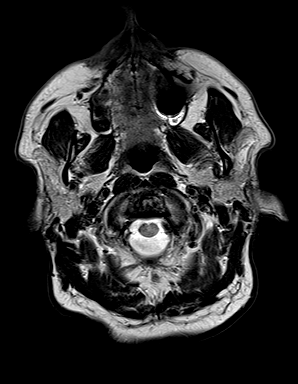
[im 22/22]
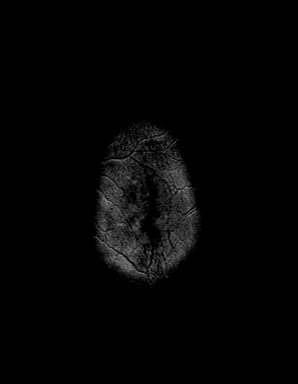

[Series 7: FLAIR · axial · 3.0mm · 0.45mm/px · z∈[-41,+114]mm · 3 of 27 slices shown]
[im 1/27]
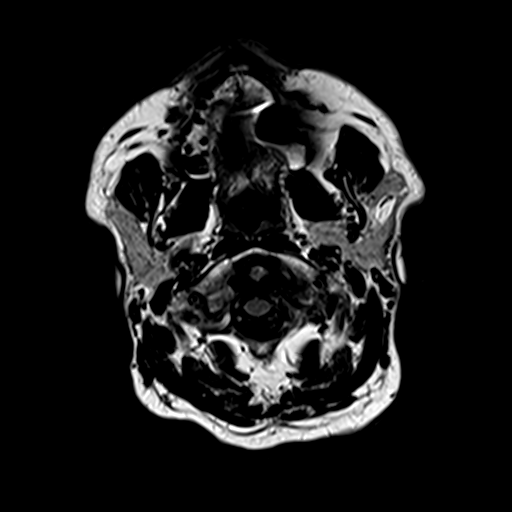
[im 14/27]
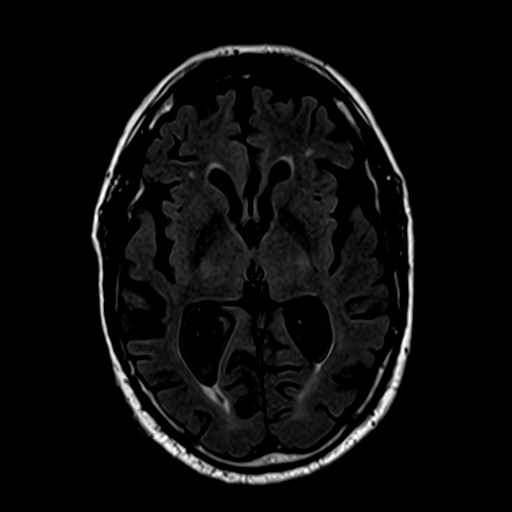
[im 27/27]
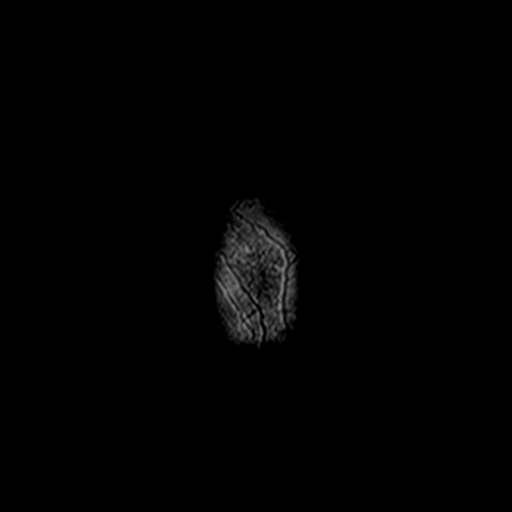

[Series 9: swi_images · axial · 2.0mm · 0.90mm/px · z∈[-34,+107]mm · 7 of 72 slices shown]
[im 1/72]
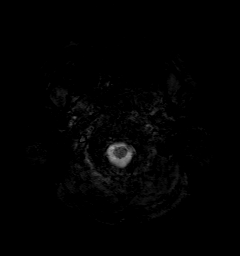
[im 12/72]
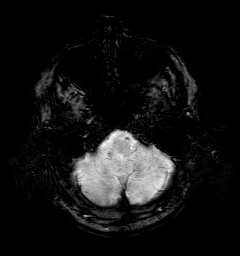
[im 24/72]
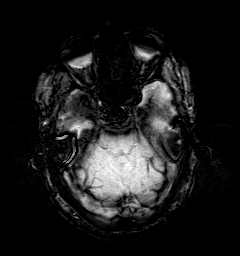
[im 36/72]
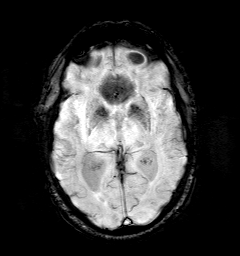
[im 48/72]
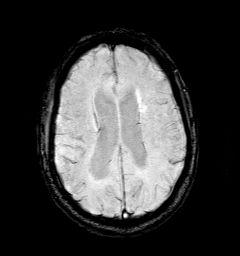
[im 60/72]
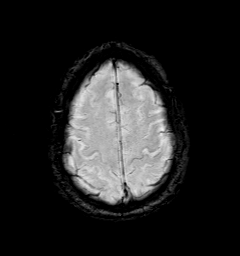
[im 72/72]
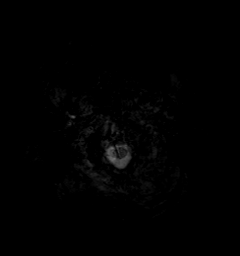

[Series 10: t1_mpr_tra · axial · 1.1mm · 0.72mm/px · z∈[-41,+115]mm · 15 of 144 slices shown]
[im 1/144]
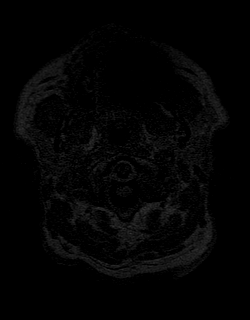
[im 11/144]
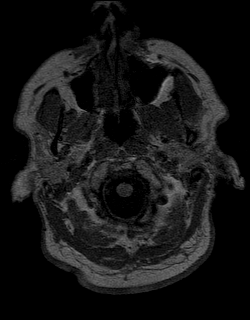
[im 21/144]
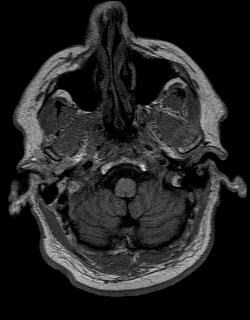
[im 31/144]
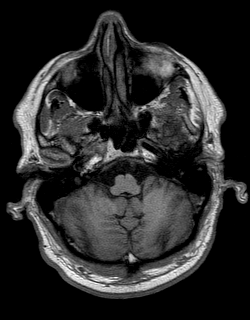
[im 41/144]
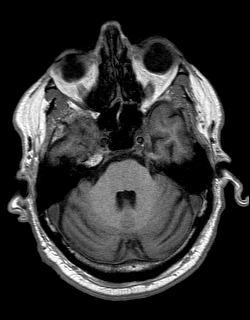
[im 52/144]
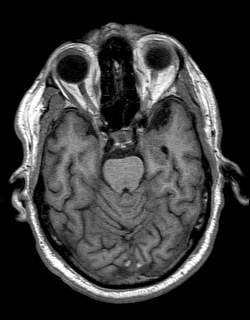
[im 62/144]
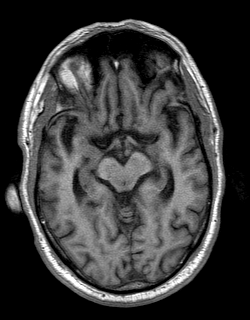
[im 72/144]
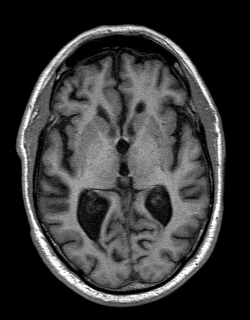
[im 82/144]
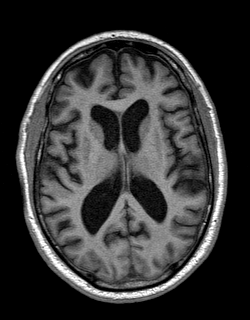
[im 92/144]
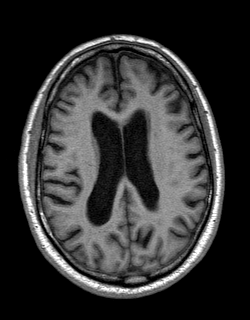
[im 103/144]
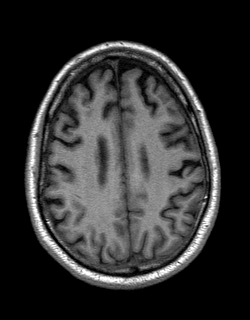
[im 113/144]
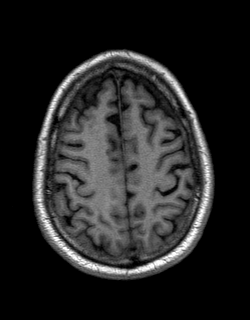
[im 123/144]
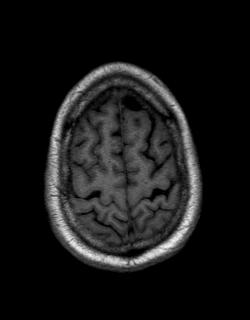
[im 133/144]
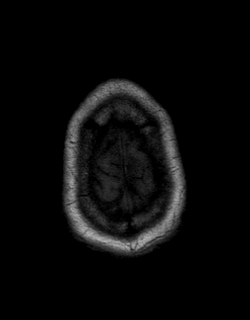
[im 144/144]
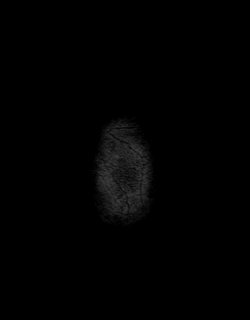

[Series 11: T2 · coronal · 5.0mm · 0.45mm/px · 3 of 25 slices shown (2 of 2)]
[im 1/25]
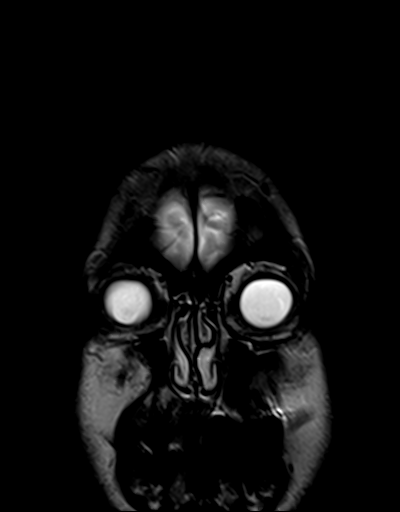
[im 13/25]
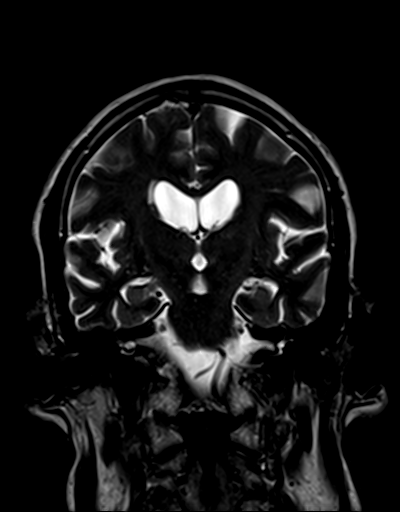
[im 25/25]
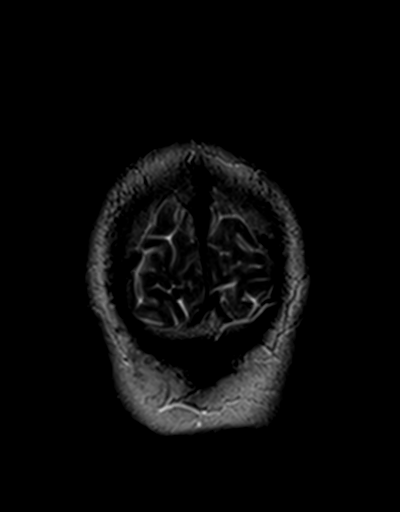

[48 of 48 positions shown; findings below may reference images not displayed]

FINDINGS: Brain: No evidence for acute stroke, hemorrhage, mass lesion, or
extra-axial fluid. Central atrophy with hydrocephalus ex vacuo. Mild
subcortical and periventricular T2 and FLAIR hyperintensities,
likely chronic microvascular ischemic change.

Vascular: Normal flow voids.  Dominant LEFT vertebral.

Skull and upper cervical spine: Normal marrow signal.

Sinuses/Orbits: Negative.  BILATERAL cataract extraction.

Other: None.
IMPRESSION: Global atrophy. Mild small vessel disease. No acute intracranial
findings.

No posttraumatic sequelae are evident.

No evidence for metastatic prostate cancer to the brain or dura.

## 2019-04-18 ENCOUNTER — Other Ambulatory Visit (HOSPITAL_COMMUNITY): Payer: Self-pay | Admitting: Endocrinology

## 2019-04-18 ENCOUNTER — Encounter (HOSPITAL_COMMUNITY): Payer: Self-pay | Admitting: Endocrinology

## 2019-04-18 DIAGNOSIS — I129 Hypertensive chronic kidney disease with stage 1 through stage 4 chronic kidney disease, or unspecified chronic kidney disease: Secondary | ICD-10-CM

## 2019-04-18 DIAGNOSIS — R0789 Other chest pain: Secondary | ICD-10-CM

## 2019-04-18 DIAGNOSIS — I739 Peripheral vascular disease, unspecified: Secondary | ICD-10-CM

## 2019-04-18 DIAGNOSIS — R0609 Other forms of dyspnea: Secondary | ICD-10-CM

## 2019-05-02 ENCOUNTER — Ambulatory Visit (HOSPITAL_COMMUNITY): Payer: Medicare Other | Attending: Cardiology

## 2019-05-02 ENCOUNTER — Other Ambulatory Visit: Payer: Self-pay

## 2019-05-02 DIAGNOSIS — R0609 Other forms of dyspnea: Secondary | ICD-10-CM

## 2019-05-02 DIAGNOSIS — R0789 Other chest pain: Secondary | ICD-10-CM | POA: Diagnosis present

## 2019-05-02 DIAGNOSIS — I129 Hypertensive chronic kidney disease with stage 1 through stage 4 chronic kidney disease, or unspecified chronic kidney disease: Secondary | ICD-10-CM | POA: Diagnosis present

## 2019-05-02 DIAGNOSIS — R0600 Dyspnea, unspecified: Secondary | ICD-10-CM | POA: Diagnosis present

## 2019-05-02 DIAGNOSIS — I739 Peripheral vascular disease, unspecified: Secondary | ICD-10-CM | POA: Diagnosis not present

## 2019-06-30 ENCOUNTER — Other Ambulatory Visit: Payer: Self-pay

## 2019-06-30 ENCOUNTER — Ambulatory Visit (INDEPENDENT_AMBULATORY_CARE_PROVIDER_SITE_OTHER): Payer: Medicare Other | Admitting: Otolaryngology

## 2019-06-30 VITALS — Temp 98.4°F

## 2019-06-30 DIAGNOSIS — H7291 Unspecified perforation of tympanic membrane, right ear: Secondary | ICD-10-CM | POA: Diagnosis not present

## 2019-06-30 DIAGNOSIS — H903 Sensorineural hearing loss, bilateral: Secondary | ICD-10-CM

## 2019-06-30 NOTE — Progress Notes (Signed)
HPI: Eric [name] is a 79 y.o. male who presents is referred by [facility] for evaluation of right TM perforation.  He apparently has some drainage and initial notice of the TM perforation in September.  This was treated with Ciprodex eardrops.  He has had no further drainage and is not sure how he got the TM perforation in the first place.  He was seen recently at [facility] and was noted to have a large right TM perforation and was referred here.  On review of his audiogram this demonstrated bilateral sensorineural hearing loss with additional 10-20 DB conductive component in the right ear. He denies any trauma to the ear. He has had no drainage from the ear recently.  No pain or discomfort associated with the ear..  Past Medical History:  Diagnosis Date  . Anxiety   . Diabetes mellitus    insulin pump  . Hearing problem   . History of blurred vision   . Hyperlipidemia   . Hypertension   . Impotence of organic origin   . Malignant neoplasm prostate ([place])   . Nocturia   . Right shoulder pain    "due to prior surgery"  . Shoulder injury    continues to have occ pain  . Snoring disorder    "loud " per wife.   . Stress incontinence, male   . Type I diabetes mellitus ([place])   . Ventral hernia    Past Surgical History:  Procedure Laterality Date  . CYSTOSCOPY  01/06/2012   Procedure: CYSTOSCOPY FLEXIBLE;  Surgeon: Ailene Rud, MD;  Location: [place];  Service: Urology;  Laterality: N/A;  . CYSTOSCOPY WITH URETHRAL DILATATION  01/06/2012   Procedure: CYSTOSCOPY WITH URETHRAL DILATATION;  Surgeon: Ailene Rud, MD;  Location: [place];  Service: Urology;  Laterality: N/A;  insertion of foley catheter  . EYE SURGERY  12& 07/2010   macular pucker  . HERNIA REPAIR    . PROSTATE SURGERY  02/2010  . VENTRAL HERNIA REPAIR  01/06/2012   Procedure: LAPAROSCOPIC VENTRAL HERNIA;  Surgeon: Merrie Roof, MD;  Location: [facility];  Service: General;  Laterality: N/A;    Social History   Socioeconomic History  . Marital status: Married    Spouse name: Not on file  . Number of children: Not on file  . Years of education: Not on file  . Highest education level: Not on file  Occupational History  . Not on file  Tobacco Use  . Smoking status: Never Smoker  . Smokeless tobacco: Never Used  Substance and Sexual Activity  . Alcohol use: No  . Drug use: No  . Sexual activity: Not on file  Other Topics Concern  . Not on file  Social History Narrative  . Not on file   Social Determinants of Health   Financial Resource Strain:   . Difficulty of Paying Living Expenses: Not on file  Food Insecurity:   . Worried About Charity fundraiser in the Last Year: Not on file  . Ran Out of Food in the Last Year: Not on file  Transportation Needs:   . Lack of Transportation (Medical): Not on file  . Lack of Transportation (Non-Medical): Not on file  Physical Activity:   . Days of Exercise per Week: Not on file  . Minutes of Exercise per Session: Not on file  Stress:   . Feeling of Stress : Not on file  Social Connections:   . Frequency of  Communication with Friends and Family: Not on file  . Frequency of Social Gatherings with Friends and Family: Not on file  . Attends Religious Services: Not on file  . Active Member of Clubs or Organizations: Not on file  . Attends Archivist Meetings: Not on file  . Marital Status: Not on file   Family History  Problem Relation Age of Onset  . Stroke Mother   . Heart disease Father   . Cancer Unknown   . Heart disease Unknown   . Diabetes Unknown    Allergies  Allergen Reactions  . Beef-Derived Products Swelling    NO BEEF,PORK & LAMB   Prior to Admission medications   Medication Sig Start Date End Date Taking? Authorizing Provider  amLODipine (NORVASC) 5 MG tablet Take 5 mg by mouth daily.  11/26/11  Yes [provider]  aspirin 81 MG tablet Take 81 mg by mouth daily.   Yes [provider]  citalopram (CELEXA) 20 MG tablet Take 20 mg by mouth daily.   Yes [provider]  insulin aspart (NOVOLOG) 100 UNIT/ML injection Inject into the skin continuous.    Yes [provider]  LORazepam (ATIVAN) 0.5 MG tablet Take 0.5 mg by mouth every 8 (eight) hours.   Yes [provider]  omeprazole (PRILOSEC) 40 MG capsule Take 40 mg by mouth daily.   Yes [provider]  tiZANidine (ZANAFLEX) 2 MG tablet Take 2 tablets (4 mg total) by mouth 3 (three) times daily. 12/15/16  Yes Jaffe, Adam R, DO  tiZANidine (ZANAFLEX) 2 MG tablet TAKE 1 TABLET BY MOUTH EVERY NIGHT AT BEDTIME X 1 WEEK, 1 TWICE DAILY X 1 WEEK, 1 THREE TIMES DAILY X 1 WEEK, 2 TABLETS THREE TIMES DAILY 01/14/17  Yes Jaffe, Adam R, DO     Positive ROS: Otherwise negative  All other systems have been reviewed and were otherwise negative with the exception of those mentioned in the HPI and as above.  Physical Exam: Constitutional: Alert, well-appearing, no acute distress Ears: External ears without lesions or tenderness. Ear canals are clear bilaterally.  Left TM is intact and clear.  Right TM reveals a large 30 to 40% central inferior TM perforation.  This is dry with no signs of infection.  No evidence of cholesteatoma.  On tuning fork testing he had minimal conductive component with testing with a 512 tuning fork. Nasal: External nose without lesions. Septum with minimal deformity.. Clear nasal passages Oral: Lips and gums without lesions. Tongue and palate mucosa without lesions. Posterior oropharynx clear. Neck: No palpable adenopathy or masses Respiratory: Breathing comfortably  Skin: No facial/neck lesions or rash noted.  Procedures  Assessment: Chronic right TM perforation.  No signs of infection. Bilateral sensorineural hearing loss with additional mild conductive component in the right ear  Plan: Would recommend use of hearing aids. He will follow-up if he notices any  drainage from his ears. He will follow-up in 6 months for recheck of the right TM perforation.   Radene Journey, MD   CC: [facility]

## 2019-07-01 ENCOUNTER — Encounter (INDEPENDENT_AMBULATORY_CARE_PROVIDER_SITE_OTHER): Payer: Self-pay

## 2020-04-23 DIAGNOSIS — I10 Essential (primary) hypertension: Secondary | ICD-10-CM | POA: Diagnosis not present

## 2020-05-10 DIAGNOSIS — Z1212 Encounter for screening for malignant neoplasm of rectum: Secondary | ICD-10-CM | POA: Diagnosis not present

## 2020-05-30 DIAGNOSIS — E103212 Type 1 diabetes mellitus with mild nonproliferative diabetic retinopathy with macular edema, left eye: Secondary | ICD-10-CM | POA: Diagnosis not present

## 2020-05-30 DIAGNOSIS — E103291 Type 1 diabetes mellitus with mild nonproliferative diabetic retinopathy without macular edema, right eye: Secondary | ICD-10-CM | POA: Diagnosis not present

## 2020-05-30 DIAGNOSIS — H43811 Vitreous degeneration, right eye: Secondary | ICD-10-CM | POA: Diagnosis not present

## 2020-06-05 DIAGNOSIS — Z4681 Encounter for fitting and adjustment of insulin pump: Secondary | ICD-10-CM | POA: Diagnosis not present

## 2020-06-05 DIAGNOSIS — Z794 Long term (current) use of insulin: Secondary | ICD-10-CM | POA: Diagnosis not present

## 2020-06-05 DIAGNOSIS — I129 Hypertensive chronic kidney disease with stage 1 through stage 4 chronic kidney disease, or unspecified chronic kidney disease: Secondary | ICD-10-CM | POA: Diagnosis not present

## 2020-06-05 DIAGNOSIS — E1029 Type 1 diabetes mellitus with other diabetic kidney complication: Secondary | ICD-10-CM | POA: Diagnosis not present

## 2020-06-05 DIAGNOSIS — N1831 Chronic kidney disease, stage 3a: Secondary | ICD-10-CM | POA: Diagnosis not present

## 2020-08-03 DIAGNOSIS — Z85828 Personal history of other malignant neoplasm of skin: Secondary | ICD-10-CM | POA: Diagnosis not present

## 2020-08-03 DIAGNOSIS — D225 Melanocytic nevi of trunk: Secondary | ICD-10-CM | POA: Diagnosis not present

## 2020-08-03 DIAGNOSIS — L570 Actinic keratosis: Secondary | ICD-10-CM | POA: Diagnosis not present

## 2020-08-03 DIAGNOSIS — D1801 Hemangioma of skin and subcutaneous tissue: Secondary | ICD-10-CM | POA: Diagnosis not present

## 2020-08-03 DIAGNOSIS — L820 Inflamed seborrheic keratosis: Secondary | ICD-10-CM | POA: Diagnosis not present

## 2020-08-03 DIAGNOSIS — L821 Other seborrheic keratosis: Secondary | ICD-10-CM | POA: Diagnosis not present

## 2020-08-03 DIAGNOSIS — C44629 Squamous cell carcinoma of skin of left upper limb, including shoulder: Secondary | ICD-10-CM | POA: Diagnosis not present

## 2020-08-03 DIAGNOSIS — L853 Xerosis cutis: Secondary | ICD-10-CM | POA: Diagnosis not present

## 2020-08-20 DIAGNOSIS — F411 Generalized anxiety disorder: Secondary | ICD-10-CM | POA: Diagnosis not present

## 2020-08-20 DIAGNOSIS — E103212 Type 1 diabetes mellitus with mild nonproliferative diabetic retinopathy with macular edema, left eye: Secondary | ICD-10-CM | POA: Diagnosis not present

## 2020-08-20 DIAGNOSIS — Z4681 Encounter for fitting and adjustment of insulin pump: Secondary | ICD-10-CM | POA: Diagnosis not present

## 2020-08-20 DIAGNOSIS — I739 Peripheral vascular disease, unspecified: Secondary | ICD-10-CM | POA: Diagnosis not present

## 2020-08-20 DIAGNOSIS — N1831 Chronic kidney disease, stage 3a: Secondary | ICD-10-CM | POA: Diagnosis not present

## 2020-08-20 DIAGNOSIS — E1029 Type 1 diabetes mellitus with other diabetic kidney complication: Secondary | ICD-10-CM | POA: Diagnosis not present

## 2020-08-20 DIAGNOSIS — Z794 Long term (current) use of insulin: Secondary | ICD-10-CM | POA: Diagnosis not present

## 2020-08-20 DIAGNOSIS — I129 Hypertensive chronic kidney disease with stage 1 through stage 4 chronic kidney disease, or unspecified chronic kidney disease: Secondary | ICD-10-CM | POA: Diagnosis not present

## 2020-08-20 DIAGNOSIS — G4730 Sleep apnea, unspecified: Secondary | ICD-10-CM | POA: Diagnosis not present

## 2020-08-20 DIAGNOSIS — I679 Cerebrovascular disease, unspecified: Secondary | ICD-10-CM | POA: Diagnosis not present

## 2020-08-20 DIAGNOSIS — I5189 Other ill-defined heart diseases: Secondary | ICD-10-CM | POA: Diagnosis not present

## 2020-08-20 DIAGNOSIS — E785 Hyperlipidemia, unspecified: Secondary | ICD-10-CM | POA: Diagnosis not present

## 2020-10-23 DIAGNOSIS — N1831 Chronic kidney disease, stage 3a: Secondary | ICD-10-CM | POA: Diagnosis not present

## 2020-10-23 DIAGNOSIS — E1029 Type 1 diabetes mellitus with other diabetic kidney complication: Secondary | ICD-10-CM | POA: Diagnosis not present

## 2020-10-23 DIAGNOSIS — E785 Hyperlipidemia, unspecified: Secondary | ICD-10-CM | POA: Diagnosis not present

## 2020-10-23 DIAGNOSIS — I129 Hypertensive chronic kidney disease with stage 1 through stage 4 chronic kidney disease, or unspecified chronic kidney disease: Secondary | ICD-10-CM | POA: Diagnosis not present

## 2020-10-23 DIAGNOSIS — Z794 Long term (current) use of insulin: Secondary | ICD-10-CM | POA: Diagnosis not present

## 2020-10-23 DIAGNOSIS — Z4681 Encounter for fitting and adjustment of insulin pump: Secondary | ICD-10-CM | POA: Diagnosis not present

## 2020-11-20 DIAGNOSIS — E785 Hyperlipidemia, unspecified: Secondary | ICD-10-CM | POA: Diagnosis not present

## 2020-11-20 DIAGNOSIS — I6529 Occlusion and stenosis of unspecified carotid artery: Secondary | ICD-10-CM | POA: Diagnosis not present

## 2020-11-20 DIAGNOSIS — I129 Hypertensive chronic kidney disease with stage 1 through stage 4 chronic kidney disease, or unspecified chronic kidney disease: Secondary | ICD-10-CM | POA: Diagnosis not present

## 2020-11-20 DIAGNOSIS — E103212 Type 1 diabetes mellitus with mild nonproliferative diabetic retinopathy with macular edema, left eye: Secondary | ICD-10-CM | POA: Diagnosis not present

## 2020-11-20 DIAGNOSIS — I739 Peripheral vascular disease, unspecified: Secondary | ICD-10-CM | POA: Diagnosis not present

## 2020-11-20 DIAGNOSIS — I679 Cerebrovascular disease, unspecified: Secondary | ICD-10-CM | POA: Diagnosis not present

## 2020-11-20 DIAGNOSIS — N1831 Chronic kidney disease, stage 3a: Secondary | ICD-10-CM | POA: Diagnosis not present

## 2020-11-20 DIAGNOSIS — F411 Generalized anxiety disorder: Secondary | ICD-10-CM | POA: Diagnosis not present

## 2020-11-20 DIAGNOSIS — E1029 Type 1 diabetes mellitus with other diabetic kidney complication: Secondary | ICD-10-CM | POA: Diagnosis not present

## 2020-11-20 DIAGNOSIS — I5189 Other ill-defined heart diseases: Secondary | ICD-10-CM | POA: Diagnosis not present

## 2020-11-20 DIAGNOSIS — C61 Malignant neoplasm of prostate: Secondary | ICD-10-CM | POA: Diagnosis not present

## 2020-11-20 DIAGNOSIS — Z4681 Encounter for fitting and adjustment of insulin pump: Secondary | ICD-10-CM | POA: Diagnosis not present

## 2021-01-18 DIAGNOSIS — Z23 Encounter for immunization: Secondary | ICD-10-CM | POA: Diagnosis not present

## 2021-01-23 DIAGNOSIS — E1029 Type 1 diabetes mellitus with other diabetic kidney complication: Secondary | ICD-10-CM | POA: Diagnosis not present

## 2021-01-23 DIAGNOSIS — E785 Hyperlipidemia, unspecified: Secondary | ICD-10-CM | POA: Diagnosis not present

## 2021-01-23 DIAGNOSIS — Z4681 Encounter for fitting and adjustment of insulin pump: Secondary | ICD-10-CM | POA: Diagnosis not present

## 2021-01-23 DIAGNOSIS — N1831 Chronic kidney disease, stage 3a: Secondary | ICD-10-CM | POA: Diagnosis not present

## 2021-01-23 DIAGNOSIS — Z23 Encounter for immunization: Secondary | ICD-10-CM | POA: Diagnosis not present

## 2021-01-23 DIAGNOSIS — I129 Hypertensive chronic kidney disease with stage 1 through stage 4 chronic kidney disease, or unspecified chronic kidney disease: Secondary | ICD-10-CM | POA: Diagnosis not present

## 2021-01-23 DIAGNOSIS — Z794 Long term (current) use of insulin: Secondary | ICD-10-CM | POA: Diagnosis not present

## 2021-02-05 DIAGNOSIS — L570 Actinic keratosis: Secondary | ICD-10-CM | POA: Diagnosis not present

## 2021-02-05 DIAGNOSIS — L821 Other seborrheic keratosis: Secondary | ICD-10-CM | POA: Diagnosis not present

## 2021-02-05 DIAGNOSIS — L820 Inflamed seborrheic keratosis: Secondary | ICD-10-CM | POA: Diagnosis not present

## 2021-02-05 DIAGNOSIS — Z85828 Personal history of other malignant neoplasm of skin: Secondary | ICD-10-CM | POA: Diagnosis not present

## 2021-02-05 DIAGNOSIS — D485 Neoplasm of uncertain behavior of skin: Secondary | ICD-10-CM | POA: Diagnosis not present

## 2021-02-05 DIAGNOSIS — L814 Other melanin hyperpigmentation: Secondary | ICD-10-CM | POA: Diagnosis not present

## 2021-04-17 DIAGNOSIS — Z125 Encounter for screening for malignant neoplasm of prostate: Secondary | ICD-10-CM | POA: Diagnosis not present

## 2021-04-17 DIAGNOSIS — E785 Hyperlipidemia, unspecified: Secondary | ICD-10-CM | POA: Diagnosis not present

## 2021-04-17 DIAGNOSIS — I10 Essential (primary) hypertension: Secondary | ICD-10-CM | POA: Diagnosis not present

## 2021-04-17 DIAGNOSIS — E1029 Type 1 diabetes mellitus with other diabetic kidney complication: Secondary | ICD-10-CM | POA: Diagnosis not present

## 2021-04-17 DIAGNOSIS — E103212 Type 1 diabetes mellitus with mild nonproliferative diabetic retinopathy with macular edema, left eye: Secondary | ICD-10-CM | POA: Diagnosis not present

## 2021-04-17 DIAGNOSIS — Z794 Long term (current) use of insulin: Secondary | ICD-10-CM | POA: Diagnosis not present

## 2021-04-25 DIAGNOSIS — Z4681 Encounter for fitting and adjustment of insulin pump: Secondary | ICD-10-CM | POA: Diagnosis not present

## 2021-04-25 DIAGNOSIS — Z0000 Encounter for general adult medical examination without abnormal findings: Secondary | ICD-10-CM | POA: Diagnosis not present

## 2021-04-25 DIAGNOSIS — I10 Essential (primary) hypertension: Secondary | ICD-10-CM | POA: Diagnosis not present

## 2021-04-25 DIAGNOSIS — Z1212 Encounter for screening for malignant neoplasm of rectum: Secondary | ICD-10-CM | POA: Diagnosis not present

## 2021-04-25 DIAGNOSIS — N1831 Chronic kidney disease, stage 3a: Secondary | ICD-10-CM | POA: Diagnosis not present

## 2021-04-25 DIAGNOSIS — I129 Hypertensive chronic kidney disease with stage 1 through stage 4 chronic kidney disease, or unspecified chronic kidney disease: Secondary | ICD-10-CM | POA: Diagnosis not present

## 2021-04-25 DIAGNOSIS — Z8546 Personal history of malignant neoplasm of prostate: Secondary | ICD-10-CM | POA: Diagnosis not present

## 2021-04-25 DIAGNOSIS — E785 Hyperlipidemia, unspecified: Secondary | ICD-10-CM | POA: Diagnosis not present

## 2021-04-25 DIAGNOSIS — Z1331 Encounter for screening for depression: Secondary | ICD-10-CM | POA: Diagnosis not present

## 2021-04-25 DIAGNOSIS — Z794 Long term (current) use of insulin: Secondary | ICD-10-CM | POA: Diagnosis not present

## 2021-04-25 DIAGNOSIS — Z23 Encounter for immunization: Secondary | ICD-10-CM | POA: Diagnosis not present

## 2021-04-25 DIAGNOSIS — E039 Hypothyroidism, unspecified: Secondary | ICD-10-CM | POA: Diagnosis not present

## 2021-04-25 DIAGNOSIS — E1029 Type 1 diabetes mellitus with other diabetic kidney complication: Secondary | ICD-10-CM | POA: Diagnosis not present

## 2021-04-25 DIAGNOSIS — Z1389 Encounter for screening for other disorder: Secondary | ICD-10-CM | POA: Diagnosis not present

## 2021-04-25 DIAGNOSIS — R82998 Other abnormal findings in urine: Secondary | ICD-10-CM | POA: Diagnosis not present

## 2021-04-25 DIAGNOSIS — E103212 Type 1 diabetes mellitus with mild nonproliferative diabetic retinopathy with macular edema, left eye: Secondary | ICD-10-CM | POA: Diagnosis not present

## 2021-06-04 DIAGNOSIS — E103291 Type 1 diabetes mellitus with mild nonproliferative diabetic retinopathy without macular edema, right eye: Secondary | ICD-10-CM | POA: Diagnosis not present

## 2021-06-04 DIAGNOSIS — E103312 Type 1 diabetes mellitus with moderate nonproliferative diabetic retinopathy with macular edema, left eye: Secondary | ICD-10-CM | POA: Diagnosis not present

## 2021-06-04 DIAGNOSIS — H43811 Vitreous degeneration, right eye: Secondary | ICD-10-CM | POA: Diagnosis not present

## 2021-06-04 DIAGNOSIS — H31091 Other chorioretinal scars, right eye: Secondary | ICD-10-CM | POA: Diagnosis not present

## 2021-07-02 DIAGNOSIS — I129 Hypertensive chronic kidney disease with stage 1 through stage 4 chronic kidney disease, or unspecified chronic kidney disease: Secondary | ICD-10-CM | POA: Diagnosis not present

## 2021-07-02 DIAGNOSIS — E1029 Type 1 diabetes mellitus with other diabetic kidney complication: Secondary | ICD-10-CM | POA: Diagnosis not present

## 2021-07-02 DIAGNOSIS — N1831 Chronic kidney disease, stage 3a: Secondary | ICD-10-CM | POA: Diagnosis not present

## 2021-07-02 DIAGNOSIS — Z4681 Encounter for fitting and adjustment of insulin pump: Secondary | ICD-10-CM | POA: Diagnosis not present

## 2021-07-02 DIAGNOSIS — E785 Hyperlipidemia, unspecified: Secondary | ICD-10-CM | POA: Diagnosis not present

## 2021-07-02 DIAGNOSIS — Z794 Long term (current) use of insulin: Secondary | ICD-10-CM | POA: Diagnosis not present

## 2021-08-05 DIAGNOSIS — Z85828 Personal history of other malignant neoplasm of skin: Secondary | ICD-10-CM | POA: Diagnosis not present

## 2021-08-05 DIAGNOSIS — L820 Inflamed seborrheic keratosis: Secondary | ICD-10-CM | POA: Diagnosis not present

## 2021-08-05 DIAGNOSIS — D1801 Hemangioma of skin and subcutaneous tissue: Secondary | ICD-10-CM | POA: Diagnosis not present

## 2021-08-05 DIAGNOSIS — L821 Other seborrheic keratosis: Secondary | ICD-10-CM | POA: Diagnosis not present

## 2021-08-05 DIAGNOSIS — L814 Other melanin hyperpigmentation: Secondary | ICD-10-CM | POA: Diagnosis not present

## 2021-08-05 DIAGNOSIS — C44219 Basal cell carcinoma of skin of left ear and external auricular canal: Secondary | ICD-10-CM | POA: Diagnosis not present

## 2021-08-05 DIAGNOSIS — L570 Actinic keratosis: Secondary | ICD-10-CM | POA: Diagnosis not present

## 2021-08-28 DIAGNOSIS — E785 Hyperlipidemia, unspecified: Secondary | ICD-10-CM | POA: Diagnosis not present

## 2021-08-28 DIAGNOSIS — E1029 Type 1 diabetes mellitus with other diabetic kidney complication: Secondary | ICD-10-CM | POA: Diagnosis not present

## 2021-08-28 DIAGNOSIS — I6529 Occlusion and stenosis of unspecified carotid artery: Secondary | ICD-10-CM | POA: Diagnosis not present

## 2021-08-28 DIAGNOSIS — Z794 Long term (current) use of insulin: Secondary | ICD-10-CM | POA: Diagnosis not present

## 2021-08-28 DIAGNOSIS — E103212 Type 1 diabetes mellitus with mild nonproliferative diabetic retinopathy with macular edema, left eye: Secondary | ICD-10-CM | POA: Diagnosis not present

## 2021-08-28 DIAGNOSIS — I739 Peripheral vascular disease, unspecified: Secondary | ICD-10-CM | POA: Diagnosis not present

## 2021-08-28 DIAGNOSIS — N1831 Chronic kidney disease, stage 3a: Secondary | ICD-10-CM | POA: Diagnosis not present

## 2021-08-28 DIAGNOSIS — Z4681 Encounter for fitting and adjustment of insulin pump: Secondary | ICD-10-CM | POA: Diagnosis not present

## 2021-08-28 DIAGNOSIS — I129 Hypertensive chronic kidney disease with stage 1 through stage 4 chronic kidney disease, or unspecified chronic kidney disease: Secondary | ICD-10-CM | POA: Diagnosis not present

## 2021-08-28 DIAGNOSIS — Z8546 Personal history of malignant neoplasm of prostate: Secondary | ICD-10-CM | POA: Diagnosis not present

## 2021-08-28 DIAGNOSIS — D696 Thrombocytopenia, unspecified: Secondary | ICD-10-CM | POA: Diagnosis not present

## 2021-08-28 DIAGNOSIS — E039 Hypothyroidism, unspecified: Secondary | ICD-10-CM | POA: Diagnosis not present

## 2021-09-12 DIAGNOSIS — Z85828 Personal history of other malignant neoplasm of skin: Secondary | ICD-10-CM | POA: Diagnosis not present

## 2021-09-12 DIAGNOSIS — C44219 Basal cell carcinoma of skin of left ear and external auricular canal: Secondary | ICD-10-CM | POA: Diagnosis not present

## 2021-10-29 DIAGNOSIS — E785 Hyperlipidemia, unspecified: Secondary | ICD-10-CM | POA: Diagnosis not present

## 2021-10-29 DIAGNOSIS — E1029 Type 1 diabetes mellitus with other diabetic kidney complication: Secondary | ICD-10-CM | POA: Diagnosis not present

## 2021-10-29 DIAGNOSIS — Z794 Long term (current) use of insulin: Secondary | ICD-10-CM | POA: Diagnosis not present

## 2021-10-29 DIAGNOSIS — Z4681 Encounter for fitting and adjustment of insulin pump: Secondary | ICD-10-CM | POA: Diagnosis not present

## 2021-10-29 DIAGNOSIS — I129 Hypertensive chronic kidney disease with stage 1 through stage 4 chronic kidney disease, or unspecified chronic kidney disease: Secondary | ICD-10-CM | POA: Diagnosis not present

## 2021-10-29 DIAGNOSIS — N1831 Chronic kidney disease, stage 3a: Secondary | ICD-10-CM | POA: Diagnosis not present

## 2022-01-15 DIAGNOSIS — Z794 Long term (current) use of insulin: Secondary | ICD-10-CM | POA: Diagnosis not present

## 2022-01-15 DIAGNOSIS — I129 Hypertensive chronic kidney disease with stage 1 through stage 4 chronic kidney disease, or unspecified chronic kidney disease: Secondary | ICD-10-CM | POA: Diagnosis not present

## 2022-01-15 DIAGNOSIS — I739 Peripheral vascular disease, unspecified: Secondary | ICD-10-CM | POA: Diagnosis not present

## 2022-01-15 DIAGNOSIS — E785 Hyperlipidemia, unspecified: Secondary | ICD-10-CM | POA: Diagnosis not present

## 2022-01-15 DIAGNOSIS — Z4681 Encounter for fitting and adjustment of insulin pump: Secondary | ICD-10-CM | POA: Diagnosis not present

## 2022-01-15 DIAGNOSIS — E103212 Type 1 diabetes mellitus with mild nonproliferative diabetic retinopathy with macular edema, left eye: Secondary | ICD-10-CM | POA: Diagnosis not present

## 2022-01-15 DIAGNOSIS — Z8546 Personal history of malignant neoplasm of prostate: Secondary | ICD-10-CM | POA: Diagnosis not present

## 2022-01-15 DIAGNOSIS — D696 Thrombocytopenia, unspecified: Secondary | ICD-10-CM | POA: Diagnosis not present

## 2022-01-15 DIAGNOSIS — N1831 Chronic kidney disease, stage 3a: Secondary | ICD-10-CM | POA: Diagnosis not present

## 2022-01-15 DIAGNOSIS — F330 Major depressive disorder, recurrent, mild: Secondary | ICD-10-CM | POA: Diagnosis not present

## 2022-01-15 DIAGNOSIS — E1029 Type 1 diabetes mellitus with other diabetic kidney complication: Secondary | ICD-10-CM | POA: Diagnosis not present

## 2022-01-15 DIAGNOSIS — E039 Hypothyroidism, unspecified: Secondary | ICD-10-CM | POA: Diagnosis not present

## 2022-02-24 DIAGNOSIS — D1801 Hemangioma of skin and subcutaneous tissue: Secondary | ICD-10-CM | POA: Diagnosis not present

## 2022-02-24 DIAGNOSIS — L570 Actinic keratosis: Secondary | ICD-10-CM | POA: Diagnosis not present

## 2022-02-24 DIAGNOSIS — L820 Inflamed seborrheic keratosis: Secondary | ICD-10-CM | POA: Diagnosis not present

## 2022-02-24 DIAGNOSIS — Z85828 Personal history of other malignant neoplasm of skin: Secondary | ICD-10-CM | POA: Diagnosis not present

## 2022-02-24 DIAGNOSIS — D225 Melanocytic nevi of trunk: Secondary | ICD-10-CM | POA: Diagnosis not present

## 2022-02-24 DIAGNOSIS — B078 Other viral warts: Secondary | ICD-10-CM | POA: Diagnosis not present

## 2022-02-24 DIAGNOSIS — L814 Other melanin hyperpigmentation: Secondary | ICD-10-CM | POA: Diagnosis not present

## 2022-02-24 DIAGNOSIS — C44629 Squamous cell carcinoma of skin of left upper limb, including shoulder: Secondary | ICD-10-CM | POA: Diagnosis not present

## 2022-02-24 DIAGNOSIS — L821 Other seborrheic keratosis: Secondary | ICD-10-CM | POA: Diagnosis not present

## 2022-03-18 DIAGNOSIS — Z794 Long term (current) use of insulin: Secondary | ICD-10-CM | POA: Diagnosis not present

## 2022-03-18 DIAGNOSIS — E785 Hyperlipidemia, unspecified: Secondary | ICD-10-CM | POA: Diagnosis not present

## 2022-03-18 DIAGNOSIS — I129 Hypertensive chronic kidney disease with stage 1 through stage 4 chronic kidney disease, or unspecified chronic kidney disease: Secondary | ICD-10-CM | POA: Diagnosis not present

## 2022-03-18 DIAGNOSIS — Z23 Encounter for immunization: Secondary | ICD-10-CM | POA: Diagnosis not present

## 2022-03-18 DIAGNOSIS — E1029 Type 1 diabetes mellitus with other diabetic kidney complication: Secondary | ICD-10-CM | POA: Diagnosis not present

## 2022-03-18 DIAGNOSIS — Z4681 Encounter for fitting and adjustment of insulin pump: Secondary | ICD-10-CM | POA: Diagnosis not present

## 2022-03-18 DIAGNOSIS — N1831 Chronic kidney disease, stage 3a: Secondary | ICD-10-CM | POA: Diagnosis not present

## 2022-04-02 DIAGNOSIS — Z23 Encounter for immunization: Secondary | ICD-10-CM | POA: Diagnosis not present

## 2022-05-01 DIAGNOSIS — Z125 Encounter for screening for malignant neoplasm of prostate: Secondary | ICD-10-CM | POA: Diagnosis not present

## 2022-05-01 DIAGNOSIS — E1029 Type 1 diabetes mellitus with other diabetic kidney complication: Secondary | ICD-10-CM | POA: Diagnosis not present

## 2022-05-01 DIAGNOSIS — I10 Essential (primary) hypertension: Secondary | ICD-10-CM | POA: Diagnosis not present

## 2022-05-01 DIAGNOSIS — E785 Hyperlipidemia, unspecified: Secondary | ICD-10-CM | POA: Diagnosis not present

## 2022-05-01 DIAGNOSIS — E039 Hypothyroidism, unspecified: Secondary | ICD-10-CM | POA: Diagnosis not present

## 2022-05-01 DIAGNOSIS — R7989 Other specified abnormal findings of blood chemistry: Secondary | ICD-10-CM | POA: Diagnosis not present

## 2022-05-07 DIAGNOSIS — Z1212 Encounter for screening for malignant neoplasm of rectum: Secondary | ICD-10-CM | POA: Diagnosis not present

## 2022-05-08 DIAGNOSIS — I679 Cerebrovascular disease, unspecified: Secondary | ICD-10-CM | POA: Diagnosis not present

## 2022-05-08 DIAGNOSIS — I739 Peripheral vascular disease, unspecified: Secondary | ICD-10-CM | POA: Diagnosis not present

## 2022-05-08 DIAGNOSIS — Z1331 Encounter for screening for depression: Secondary | ICD-10-CM | POA: Diagnosis not present

## 2022-05-08 DIAGNOSIS — E1029 Type 1 diabetes mellitus with other diabetic kidney complication: Secondary | ICD-10-CM | POA: Diagnosis not present

## 2022-05-08 DIAGNOSIS — Z1339 Encounter for screening examination for other mental health and behavioral disorders: Secondary | ICD-10-CM | POA: Diagnosis not present

## 2022-05-08 DIAGNOSIS — Z794 Long term (current) use of insulin: Secondary | ICD-10-CM | POA: Diagnosis not present

## 2022-05-08 DIAGNOSIS — Z0000 Encounter for general adult medical examination without abnormal findings: Secondary | ICD-10-CM | POA: Diagnosis not present

## 2022-05-08 DIAGNOSIS — N1831 Chronic kidney disease, stage 3a: Secondary | ICD-10-CM | POA: Diagnosis not present

## 2022-05-08 DIAGNOSIS — R82998 Other abnormal findings in urine: Secondary | ICD-10-CM | POA: Diagnosis not present

## 2022-05-08 DIAGNOSIS — Z8546 Personal history of malignant neoplasm of prostate: Secondary | ICD-10-CM | POA: Diagnosis not present

## 2022-05-08 DIAGNOSIS — I129 Hypertensive chronic kidney disease with stage 1 through stage 4 chronic kidney disease, or unspecified chronic kidney disease: Secondary | ICD-10-CM | POA: Diagnosis not present

## 2022-05-08 DIAGNOSIS — I6529 Occlusion and stenosis of unspecified carotid artery: Secondary | ICD-10-CM | POA: Diagnosis not present

## 2022-05-08 DIAGNOSIS — Z4681 Encounter for fitting and adjustment of insulin pump: Secondary | ICD-10-CM | POA: Diagnosis not present

## 2022-05-27 DIAGNOSIS — H43811 Vitreous degeneration, right eye: Secondary | ICD-10-CM | POA: Diagnosis not present

## 2022-05-27 DIAGNOSIS — E103291 Type 1 diabetes mellitus with mild nonproliferative diabetic retinopathy without macular edema, right eye: Secondary | ICD-10-CM | POA: Diagnosis not present

## 2022-05-27 DIAGNOSIS — H31091 Other chorioretinal scars, right eye: Secondary | ICD-10-CM | POA: Diagnosis not present

## 2022-05-27 DIAGNOSIS — E103312 Type 1 diabetes mellitus with moderate nonproliferative diabetic retinopathy with macular edema, left eye: Secondary | ICD-10-CM | POA: Diagnosis not present

## 2022-06-12 DIAGNOSIS — R399 Unspecified symptoms and signs involving the genitourinary system: Secondary | ICD-10-CM | POA: Diagnosis not present

## 2022-06-27 DIAGNOSIS — E103291 Type 1 diabetes mellitus with mild nonproliferative diabetic retinopathy without macular edema, right eye: Secondary | ICD-10-CM | POA: Diagnosis not present

## 2022-06-27 DIAGNOSIS — H43811 Vitreous degeneration, right eye: Secondary | ICD-10-CM | POA: Diagnosis not present

## 2022-06-27 DIAGNOSIS — E103312 Type 1 diabetes mellitus with moderate nonproliferative diabetic retinopathy with macular edema, left eye: Secondary | ICD-10-CM | POA: Diagnosis not present

## 2022-06-27 DIAGNOSIS — H04123 Dry eye syndrome of bilateral lacrimal glands: Secondary | ICD-10-CM | POA: Diagnosis not present

## 2022-06-27 DIAGNOSIS — H353133 Nonexudative age-related macular degeneration, bilateral, advanced atrophic without subfoveal involvement: Secondary | ICD-10-CM | POA: Diagnosis not present

## 2022-06-27 DIAGNOSIS — H31013 Macula scars of posterior pole (postinflammatory) (post-traumatic), bilateral: Secondary | ICD-10-CM | POA: Diagnosis not present

## 2022-07-16 DIAGNOSIS — E785 Hyperlipidemia, unspecified: Secondary | ICD-10-CM | POA: Diagnosis not present

## 2022-07-16 DIAGNOSIS — N1831 Chronic kidney disease, stage 3a: Secondary | ICD-10-CM | POA: Diagnosis not present

## 2022-07-16 DIAGNOSIS — Z794 Long term (current) use of insulin: Secondary | ICD-10-CM | POA: Diagnosis not present

## 2022-07-16 DIAGNOSIS — I129 Hypertensive chronic kidney disease with stage 1 through stage 4 chronic kidney disease, or unspecified chronic kidney disease: Secondary | ICD-10-CM | POA: Diagnosis not present

## 2022-07-16 DIAGNOSIS — E1029 Type 1 diabetes mellitus with other diabetic kidney complication: Secondary | ICD-10-CM | POA: Diagnosis not present

## 2022-07-16 DIAGNOSIS — Z4681 Encounter for fitting and adjustment of insulin pump: Secondary | ICD-10-CM | POA: Diagnosis not present

## 2022-08-04 DIAGNOSIS — N1831 Chronic kidney disease, stage 3a: Secondary | ICD-10-CM | POA: Diagnosis not present

## 2022-08-04 DIAGNOSIS — I129 Hypertensive chronic kidney disease with stage 1 through stage 4 chronic kidney disease, or unspecified chronic kidney disease: Secondary | ICD-10-CM | POA: Diagnosis not present

## 2022-08-04 DIAGNOSIS — I679 Cerebrovascular disease, unspecified: Secondary | ICD-10-CM | POA: Diagnosis not present

## 2022-08-04 DIAGNOSIS — Z8546 Personal history of malignant neoplasm of prostate: Secondary | ICD-10-CM | POA: Diagnosis not present

## 2022-08-04 DIAGNOSIS — E785 Hyperlipidemia, unspecified: Secondary | ICD-10-CM | POA: Diagnosis not present

## 2022-08-04 DIAGNOSIS — E1029 Type 1 diabetes mellitus with other diabetic kidney complication: Secondary | ICD-10-CM | POA: Diagnosis not present

## 2022-08-04 DIAGNOSIS — Z794 Long term (current) use of insulin: Secondary | ICD-10-CM | POA: Diagnosis not present

## 2022-08-04 DIAGNOSIS — I6529 Occlusion and stenosis of unspecified carotid artery: Secondary | ICD-10-CM | POA: Diagnosis not present

## 2022-08-04 DIAGNOSIS — I739 Peripheral vascular disease, unspecified: Secondary | ICD-10-CM | POA: Diagnosis not present

## 2022-08-04 DIAGNOSIS — E039 Hypothyroidism, unspecified: Secondary | ICD-10-CM | POA: Diagnosis not present

## 2022-08-04 DIAGNOSIS — F330 Major depressive disorder, recurrent, mild: Secondary | ICD-10-CM | POA: Diagnosis not present

## 2022-08-04 DIAGNOSIS — G4730 Sleep apnea, unspecified: Secondary | ICD-10-CM | POA: Diagnosis not present

## 2022-08-15 DIAGNOSIS — H40052 Ocular hypertension, left eye: Secondary | ICD-10-CM | POA: Diagnosis not present

## 2022-08-15 DIAGNOSIS — H524 Presbyopia: Secondary | ICD-10-CM | POA: Diagnosis not present

## 2022-08-26 DIAGNOSIS — E103291 Type 1 diabetes mellitus with mild nonproliferative diabetic retinopathy without macular edema, right eye: Secondary | ICD-10-CM | POA: Diagnosis not present

## 2022-08-26 DIAGNOSIS — H353133 Nonexudative age-related macular degeneration, bilateral, advanced atrophic without subfoveal involvement: Secondary | ICD-10-CM | POA: Diagnosis not present

## 2022-08-26 DIAGNOSIS — E103312 Type 1 diabetes mellitus with moderate nonproliferative diabetic retinopathy with macular edema, left eye: Secondary | ICD-10-CM | POA: Diagnosis not present

## 2022-08-26 DIAGNOSIS — H04123 Dry eye syndrome of bilateral lacrimal glands: Secondary | ICD-10-CM | POA: Diagnosis not present

## 2022-08-26 DIAGNOSIS — H31013 Macula scars of posterior pole (postinflammatory) (post-traumatic), bilateral: Secondary | ICD-10-CM | POA: Diagnosis not present

## 2022-08-26 DIAGNOSIS — H43811 Vitreous degeneration, right eye: Secondary | ICD-10-CM | POA: Diagnosis not present

## 2022-08-27 DIAGNOSIS — L570 Actinic keratosis: Secondary | ICD-10-CM | POA: Diagnosis not present

## 2022-08-27 DIAGNOSIS — B078 Other viral warts: Secondary | ICD-10-CM | POA: Diagnosis not present

## 2022-08-27 DIAGNOSIS — D485 Neoplasm of uncertain behavior of skin: Secondary | ICD-10-CM | POA: Diagnosis not present

## 2022-08-27 DIAGNOSIS — L814 Other melanin hyperpigmentation: Secondary | ICD-10-CM | POA: Diagnosis not present

## 2022-08-27 DIAGNOSIS — Z85828 Personal history of other malignant neoplasm of skin: Secondary | ICD-10-CM | POA: Diagnosis not present

## 2022-08-27 DIAGNOSIS — L821 Other seborrheic keratosis: Secondary | ICD-10-CM | POA: Diagnosis not present

## 2022-10-08 DIAGNOSIS — I129 Hypertensive chronic kidney disease with stage 1 through stage 4 chronic kidney disease, or unspecified chronic kidney disease: Secondary | ICD-10-CM | POA: Diagnosis not present

## 2022-10-08 DIAGNOSIS — E1029 Type 1 diabetes mellitus with other diabetic kidney complication: Secondary | ICD-10-CM | POA: Diagnosis not present

## 2022-10-08 DIAGNOSIS — N1831 Chronic kidney disease, stage 3a: Secondary | ICD-10-CM | POA: Diagnosis not present

## 2022-10-08 DIAGNOSIS — Z4681 Encounter for fitting and adjustment of insulin pump: Secondary | ICD-10-CM | POA: Diagnosis not present

## 2022-10-08 DIAGNOSIS — E785 Hyperlipidemia, unspecified: Secondary | ICD-10-CM | POA: Diagnosis not present

## 2022-10-08 DIAGNOSIS — Z794 Long term (current) use of insulin: Secondary | ICD-10-CM | POA: Diagnosis not present

## 2022-11-05 DIAGNOSIS — H04123 Dry eye syndrome of bilateral lacrimal glands: Secondary | ICD-10-CM | POA: Diagnosis not present

## 2022-11-05 DIAGNOSIS — H353133 Nonexudative age-related macular degeneration, bilateral, advanced atrophic without subfoveal involvement: Secondary | ICD-10-CM | POA: Diagnosis not present

## 2022-11-05 DIAGNOSIS — H43811 Vitreous degeneration, right eye: Secondary | ICD-10-CM | POA: Diagnosis not present

## 2022-11-05 DIAGNOSIS — H31013 Macula scars of posterior pole (postinflammatory) (post-traumatic), bilateral: Secondary | ICD-10-CM | POA: Diagnosis not present

## 2022-11-05 DIAGNOSIS — E103312 Type 1 diabetes mellitus with moderate nonproliferative diabetic retinopathy with macular edema, left eye: Secondary | ICD-10-CM | POA: Diagnosis not present

## 2022-11-05 DIAGNOSIS — E103291 Type 1 diabetes mellitus with mild nonproliferative diabetic retinopathy without macular edema, right eye: Secondary | ICD-10-CM | POA: Diagnosis not present

## 2022-12-12 DIAGNOSIS — E039 Hypothyroidism, unspecified: Secondary | ICD-10-CM | POA: Diagnosis not present

## 2022-12-12 DIAGNOSIS — D696 Thrombocytopenia, unspecified: Secondary | ICD-10-CM | POA: Diagnosis not present

## 2022-12-12 DIAGNOSIS — I739 Peripheral vascular disease, unspecified: Secondary | ICD-10-CM | POA: Diagnosis not present

## 2022-12-12 DIAGNOSIS — I6529 Occlusion and stenosis of unspecified carotid artery: Secondary | ICD-10-CM | POA: Diagnosis not present

## 2022-12-12 DIAGNOSIS — I679 Cerebrovascular disease, unspecified: Secondary | ICD-10-CM | POA: Diagnosis not present

## 2022-12-12 DIAGNOSIS — Z8546 Personal history of malignant neoplasm of prostate: Secondary | ICD-10-CM | POA: Diagnosis not present

## 2022-12-12 DIAGNOSIS — E1029 Type 1 diabetes mellitus with other diabetic kidney complication: Secondary | ICD-10-CM | POA: Diagnosis not present

## 2022-12-12 DIAGNOSIS — N1831 Chronic kidney disease, stage 3a: Secondary | ICD-10-CM | POA: Diagnosis not present

## 2022-12-12 DIAGNOSIS — F330 Major depressive disorder, recurrent, mild: Secondary | ICD-10-CM | POA: Diagnosis not present

## 2022-12-12 DIAGNOSIS — E785 Hyperlipidemia, unspecified: Secondary | ICD-10-CM | POA: Diagnosis not present

## 2022-12-12 DIAGNOSIS — F411 Generalized anxiety disorder: Secondary | ICD-10-CM | POA: Diagnosis not present

## 2022-12-12 DIAGNOSIS — I129 Hypertensive chronic kidney disease with stage 1 through stage 4 chronic kidney disease, or unspecified chronic kidney disease: Secondary | ICD-10-CM | POA: Diagnosis not present

## 2023-02-12 DIAGNOSIS — E785 Hyperlipidemia, unspecified: Secondary | ICD-10-CM | POA: Diagnosis not present

## 2023-02-12 DIAGNOSIS — N1831 Chronic kidney disease, stage 3a: Secondary | ICD-10-CM | POA: Diagnosis not present

## 2023-02-12 DIAGNOSIS — E1029 Type 1 diabetes mellitus with other diabetic kidney complication: Secondary | ICD-10-CM | POA: Diagnosis not present

## 2023-02-12 DIAGNOSIS — Z794 Long term (current) use of insulin: Secondary | ICD-10-CM | POA: Diagnosis not present

## 2023-02-12 DIAGNOSIS — Z23 Encounter for immunization: Secondary | ICD-10-CM | POA: Diagnosis not present

## 2023-02-12 DIAGNOSIS — Z4681 Encounter for fitting and adjustment of insulin pump: Secondary | ICD-10-CM | POA: Diagnosis not present

## 2023-02-12 DIAGNOSIS — I129 Hypertensive chronic kidney disease with stage 1 through stage 4 chronic kidney disease, or unspecified chronic kidney disease: Secondary | ICD-10-CM | POA: Diagnosis not present

## 2023-02-27 DIAGNOSIS — C44622 Squamous cell carcinoma of skin of right upper limb, including shoulder: Secondary | ICD-10-CM | POA: Diagnosis not present

## 2023-02-27 DIAGNOSIS — L820 Inflamed seborrheic keratosis: Secondary | ICD-10-CM | POA: Diagnosis not present

## 2023-02-27 DIAGNOSIS — C44319 Basal cell carcinoma of skin of other parts of face: Secondary | ICD-10-CM | POA: Diagnosis not present

## 2023-02-27 DIAGNOSIS — C4441 Basal cell carcinoma of skin of scalp and neck: Secondary | ICD-10-CM | POA: Diagnosis not present

## 2023-02-27 DIAGNOSIS — L821 Other seborrheic keratosis: Secondary | ICD-10-CM | POA: Diagnosis not present

## 2023-02-27 DIAGNOSIS — S80861A Insect bite (nonvenomous), right lower leg, initial encounter: Secondary | ICD-10-CM | POA: Diagnosis not present

## 2023-02-27 DIAGNOSIS — L570 Actinic keratosis: Secondary | ICD-10-CM | POA: Diagnosis not present

## 2023-02-27 DIAGNOSIS — Z85828 Personal history of other malignant neoplasm of skin: Secondary | ICD-10-CM | POA: Diagnosis not present

## 2023-03-11 DIAGNOSIS — H43811 Vitreous degeneration, right eye: Secondary | ICD-10-CM | POA: Diagnosis not present

## 2023-03-11 DIAGNOSIS — E103312 Type 1 diabetes mellitus with moderate nonproliferative diabetic retinopathy with macular edema, left eye: Secondary | ICD-10-CM | POA: Diagnosis not present

## 2023-03-11 DIAGNOSIS — H353133 Nonexudative age-related macular degeneration, bilateral, advanced atrophic without subfoveal involvement: Secondary | ICD-10-CM | POA: Diagnosis not present

## 2023-03-11 DIAGNOSIS — H04123 Dry eye syndrome of bilateral lacrimal glands: Secondary | ICD-10-CM | POA: Diagnosis not present

## 2023-03-11 DIAGNOSIS — E103291 Type 1 diabetes mellitus with mild nonproliferative diabetic retinopathy without macular edema, right eye: Secondary | ICD-10-CM | POA: Diagnosis not present

## 2023-03-11 DIAGNOSIS — H31013 Macula scars of posterior pole (postinflammatory) (post-traumatic), bilateral: Secondary | ICD-10-CM | POA: Diagnosis not present

## 2023-04-07 DIAGNOSIS — C44319 Basal cell carcinoma of skin of other parts of face: Secondary | ICD-10-CM | POA: Diagnosis not present

## 2023-04-07 DIAGNOSIS — Z85828 Personal history of other malignant neoplasm of skin: Secondary | ICD-10-CM | POA: Diagnosis not present

## 2023-04-16 DIAGNOSIS — E1029 Type 1 diabetes mellitus with other diabetic kidney complication: Secondary | ICD-10-CM | POA: Diagnosis not present

## 2023-04-16 DIAGNOSIS — E039 Hypothyroidism, unspecified: Secondary | ICD-10-CM | POA: Diagnosis not present

## 2023-04-16 DIAGNOSIS — I129 Hypertensive chronic kidney disease with stage 1 through stage 4 chronic kidney disease, or unspecified chronic kidney disease: Secondary | ICD-10-CM | POA: Diagnosis not present

## 2023-04-16 DIAGNOSIS — Z125 Encounter for screening for malignant neoplasm of prostate: Secondary | ICD-10-CM | POA: Diagnosis not present

## 2023-04-16 DIAGNOSIS — N1831 Chronic kidney disease, stage 3a: Secondary | ICD-10-CM | POA: Diagnosis not present

## 2023-04-16 DIAGNOSIS — E785 Hyperlipidemia, unspecified: Secondary | ICD-10-CM | POA: Diagnosis not present

## 2023-04-20 DIAGNOSIS — Z1339 Encounter for screening examination for other mental health and behavioral disorders: Secondary | ICD-10-CM | POA: Diagnosis not present

## 2023-04-20 DIAGNOSIS — I5189 Other ill-defined heart diseases: Secondary | ICD-10-CM | POA: Diagnosis not present

## 2023-04-20 DIAGNOSIS — F330 Major depressive disorder, recurrent, mild: Secondary | ICD-10-CM | POA: Diagnosis not present

## 2023-04-20 DIAGNOSIS — E039 Hypothyroidism, unspecified: Secondary | ICD-10-CM | POA: Diagnosis not present

## 2023-04-20 DIAGNOSIS — Z1331 Encounter for screening for depression: Secondary | ICD-10-CM | POA: Diagnosis not present

## 2023-04-20 DIAGNOSIS — E785 Hyperlipidemia, unspecified: Secondary | ICD-10-CM | POA: Diagnosis not present

## 2023-04-20 DIAGNOSIS — N1831 Chronic kidney disease, stage 3a: Secondary | ICD-10-CM | POA: Diagnosis not present

## 2023-04-20 DIAGNOSIS — Z23 Encounter for immunization: Secondary | ICD-10-CM | POA: Diagnosis not present

## 2023-04-20 DIAGNOSIS — I679 Cerebrovascular disease, unspecified: Secondary | ICD-10-CM | POA: Diagnosis not present

## 2023-04-20 DIAGNOSIS — Z8546 Personal history of malignant neoplasm of prostate: Secondary | ICD-10-CM | POA: Diagnosis not present

## 2023-04-20 DIAGNOSIS — Z0000 Encounter for general adult medical examination without abnormal findings: Secondary | ICD-10-CM | POA: Diagnosis not present

## 2023-04-20 DIAGNOSIS — R82998 Other abnormal findings in urine: Secondary | ICD-10-CM | POA: Diagnosis not present

## 2023-04-20 DIAGNOSIS — E1029 Type 1 diabetes mellitus with other diabetic kidney complication: Secondary | ICD-10-CM | POA: Diagnosis not present

## 2023-04-20 DIAGNOSIS — Z794 Long term (current) use of insulin: Secondary | ICD-10-CM | POA: Diagnosis not present

## 2023-04-20 DIAGNOSIS — I129 Hypertensive chronic kidney disease with stage 1 through stage 4 chronic kidney disease, or unspecified chronic kidney disease: Secondary | ICD-10-CM | POA: Diagnosis not present

## 2023-04-20 DIAGNOSIS — Z4681 Encounter for fitting and adjustment of insulin pump: Secondary | ICD-10-CM | POA: Diagnosis not present

## 2023-04-20 DIAGNOSIS — E103212 Type 1 diabetes mellitus with mild nonproliferative diabetic retinopathy with macular edema, left eye: Secondary | ICD-10-CM | POA: Diagnosis not present

## 2023-06-19 DIAGNOSIS — H04123 Dry eye syndrome of bilateral lacrimal glands: Secondary | ICD-10-CM | POA: Diagnosis not present

## 2023-06-19 DIAGNOSIS — H31013 Macula scars of posterior pole (postinflammatory) (post-traumatic), bilateral: Secondary | ICD-10-CM | POA: Diagnosis not present

## 2023-06-19 DIAGNOSIS — E103291 Type 1 diabetes mellitus with mild nonproliferative diabetic retinopathy without macular edema, right eye: Secondary | ICD-10-CM | POA: Diagnosis not present

## 2023-06-19 DIAGNOSIS — H43811 Vitreous degeneration, right eye: Secondary | ICD-10-CM | POA: Diagnosis not present

## 2023-06-19 DIAGNOSIS — H353133 Nonexudative age-related macular degeneration, bilateral, advanced atrophic without subfoveal involvement: Secondary | ICD-10-CM | POA: Diagnosis not present

## 2023-06-19 DIAGNOSIS — E103312 Type 1 diabetes mellitus with moderate nonproliferative diabetic retinopathy with macular edema, left eye: Secondary | ICD-10-CM | POA: Diagnosis not present

## 2023-06-24 DIAGNOSIS — I129 Hypertensive chronic kidney disease with stage 1 through stage 4 chronic kidney disease, or unspecified chronic kidney disease: Secondary | ICD-10-CM | POA: Diagnosis not present

## 2023-06-24 DIAGNOSIS — Z4681 Encounter for fitting and adjustment of insulin pump: Secondary | ICD-10-CM | POA: Diagnosis not present

## 2023-06-24 DIAGNOSIS — N1831 Chronic kidney disease, stage 3a: Secondary | ICD-10-CM | POA: Diagnosis not present

## 2023-06-24 DIAGNOSIS — Z794 Long term (current) use of insulin: Secondary | ICD-10-CM | POA: Diagnosis not present

## 2023-06-24 DIAGNOSIS — E785 Hyperlipidemia, unspecified: Secondary | ICD-10-CM | POA: Diagnosis not present

## 2023-06-24 DIAGNOSIS — E1029 Type 1 diabetes mellitus with other diabetic kidney complication: Secondary | ICD-10-CM | POA: Diagnosis not present

## 2023-07-24 DIAGNOSIS — E1029 Type 1 diabetes mellitus with other diabetic kidney complication: Secondary | ICD-10-CM | POA: Diagnosis not present

## 2023-07-24 DIAGNOSIS — I129 Hypertensive chronic kidney disease with stage 1 through stage 4 chronic kidney disease, or unspecified chronic kidney disease: Secondary | ICD-10-CM | POA: Diagnosis not present

## 2023-07-24 DIAGNOSIS — E785 Hyperlipidemia, unspecified: Secondary | ICD-10-CM | POA: Diagnosis not present

## 2023-07-24 DIAGNOSIS — N1831 Chronic kidney disease, stage 3a: Secondary | ICD-10-CM | POA: Diagnosis not present

## 2023-07-24 DIAGNOSIS — Z4681 Encounter for fitting and adjustment of insulin pump: Secondary | ICD-10-CM | POA: Diagnosis not present

## 2023-07-24 DIAGNOSIS — Z794 Long term (current) use of insulin: Secondary | ICD-10-CM | POA: Diagnosis not present

## 2023-08-25 DIAGNOSIS — N1831 Chronic kidney disease, stage 3a: Secondary | ICD-10-CM | POA: Diagnosis not present

## 2023-08-25 DIAGNOSIS — Z4681 Encounter for fitting and adjustment of insulin pump: Secondary | ICD-10-CM | POA: Diagnosis not present

## 2023-08-25 DIAGNOSIS — E039 Hypothyroidism, unspecified: Secondary | ICD-10-CM | POA: Diagnosis not present

## 2023-08-25 DIAGNOSIS — D696 Thrombocytopenia, unspecified: Secondary | ICD-10-CM | POA: Diagnosis not present

## 2023-08-25 DIAGNOSIS — G4730 Sleep apnea, unspecified: Secondary | ICD-10-CM | POA: Diagnosis not present

## 2023-08-25 DIAGNOSIS — E103212 Type 1 diabetes mellitus with mild nonproliferative diabetic retinopathy with macular edema, left eye: Secondary | ICD-10-CM | POA: Diagnosis not present

## 2023-08-25 DIAGNOSIS — H409 Unspecified glaucoma: Secondary | ICD-10-CM | POA: Diagnosis not present

## 2023-08-25 DIAGNOSIS — I739 Peripheral vascular disease, unspecified: Secondary | ICD-10-CM | POA: Diagnosis not present

## 2023-08-25 DIAGNOSIS — I1310 Hypertensive heart and chronic kidney disease without heart failure, with stage 1 through stage 4 chronic kidney disease, or unspecified chronic kidney disease: Secondary | ICD-10-CM | POA: Diagnosis not present

## 2023-08-25 DIAGNOSIS — E1029 Type 1 diabetes mellitus with other diabetic kidney complication: Secondary | ICD-10-CM | POA: Diagnosis not present

## 2023-08-25 DIAGNOSIS — E785 Hyperlipidemia, unspecified: Secondary | ICD-10-CM | POA: Diagnosis not present

## 2023-08-25 DIAGNOSIS — F330 Major depressive disorder, recurrent, mild: Secondary | ICD-10-CM | POA: Diagnosis not present

## 2023-08-31 DIAGNOSIS — L812 Freckles: Secondary | ICD-10-CM | POA: Diagnosis not present

## 2023-08-31 DIAGNOSIS — L821 Other seborrheic keratosis: Secondary | ICD-10-CM | POA: Diagnosis not present

## 2023-08-31 DIAGNOSIS — C4441 Basal cell carcinoma of skin of scalp and neck: Secondary | ICD-10-CM | POA: Diagnosis not present

## 2023-08-31 DIAGNOSIS — C44319 Basal cell carcinoma of skin of other parts of face: Secondary | ICD-10-CM | POA: Diagnosis not present

## 2023-08-31 DIAGNOSIS — L814 Other melanin hyperpigmentation: Secondary | ICD-10-CM | POA: Diagnosis not present

## 2023-08-31 DIAGNOSIS — L918 Other hypertrophic disorders of the skin: Secondary | ICD-10-CM | POA: Diagnosis not present

## 2023-08-31 DIAGNOSIS — L570 Actinic keratosis: Secondary | ICD-10-CM | POA: Diagnosis not present

## 2023-08-31 DIAGNOSIS — D034 Melanoma in situ of scalp and neck: Secondary | ICD-10-CM | POA: Diagnosis not present

## 2023-08-31 DIAGNOSIS — L820 Inflamed seborrheic keratosis: Secondary | ICD-10-CM | POA: Diagnosis not present

## 2023-08-31 DIAGNOSIS — Z85828 Personal history of other malignant neoplasm of skin: Secondary | ICD-10-CM | POA: Diagnosis not present

## 2023-09-14 DIAGNOSIS — Z85828 Personal history of other malignant neoplasm of skin: Secondary | ICD-10-CM | POA: Diagnosis not present

## 2023-09-14 DIAGNOSIS — D034 Melanoma in situ of scalp and neck: Secondary | ICD-10-CM | POA: Diagnosis not present

## 2023-10-13 DIAGNOSIS — C44319 Basal cell carcinoma of skin of other parts of face: Secondary | ICD-10-CM | POA: Diagnosis not present

## 2023-10-13 DIAGNOSIS — Z85828 Personal history of other malignant neoplasm of skin: Secondary | ICD-10-CM | POA: Diagnosis not present

## 2023-10-13 DIAGNOSIS — C4401 Basal cell carcinoma of skin of lip: Secondary | ICD-10-CM | POA: Diagnosis not present

## 2023-10-28 DIAGNOSIS — E103312 Type 1 diabetes mellitus with moderate nonproliferative diabetic retinopathy with macular edema, left eye: Secondary | ICD-10-CM | POA: Diagnosis not present

## 2023-10-28 DIAGNOSIS — H43811 Vitreous degeneration, right eye: Secondary | ICD-10-CM | POA: Diagnosis not present

## 2023-10-28 DIAGNOSIS — E103291 Type 1 diabetes mellitus with mild nonproliferative diabetic retinopathy without macular edema, right eye: Secondary | ICD-10-CM | POA: Diagnosis not present

## 2023-10-28 DIAGNOSIS — H31013 Macula scars of posterior pole (postinflammatory) (post-traumatic), bilateral: Secondary | ICD-10-CM | POA: Diagnosis not present

## 2023-10-28 DIAGNOSIS — H04123 Dry eye syndrome of bilateral lacrimal glands: Secondary | ICD-10-CM | POA: Diagnosis not present

## 2023-10-28 DIAGNOSIS — H353133 Nonexudative age-related macular degeneration, bilateral, advanced atrophic without subfoveal involvement: Secondary | ICD-10-CM | POA: Diagnosis not present

## 2023-11-04 DIAGNOSIS — E103312 Type 1 diabetes mellitus with moderate nonproliferative diabetic retinopathy with macular edema, left eye: Secondary | ICD-10-CM | POA: Diagnosis not present

## 2023-11-04 DIAGNOSIS — E103291 Type 1 diabetes mellitus with mild nonproliferative diabetic retinopathy without macular edema, right eye: Secondary | ICD-10-CM | POA: Diagnosis not present

## 2023-11-05 DIAGNOSIS — E785 Hyperlipidemia, unspecified: Secondary | ICD-10-CM | POA: Diagnosis not present

## 2023-11-05 DIAGNOSIS — N1831 Chronic kidney disease, stage 3a: Secondary | ICD-10-CM | POA: Diagnosis not present

## 2023-11-05 DIAGNOSIS — Z794 Long term (current) use of insulin: Secondary | ICD-10-CM | POA: Diagnosis not present

## 2023-11-05 DIAGNOSIS — Z4681 Encounter for fitting and adjustment of insulin pump: Secondary | ICD-10-CM | POA: Diagnosis not present

## 2023-11-05 DIAGNOSIS — I129 Hypertensive chronic kidney disease with stage 1 through stage 4 chronic kidney disease, or unspecified chronic kidney disease: Secondary | ICD-10-CM | POA: Diagnosis not present

## 2023-11-05 DIAGNOSIS — E1029 Type 1 diabetes mellitus with other diabetic kidney complication: Secondary | ICD-10-CM | POA: Diagnosis not present

## 2023-11-10 DIAGNOSIS — N3001 Acute cystitis with hematuria: Secondary | ICD-10-CM | POA: Diagnosis not present

## 2023-11-10 DIAGNOSIS — R310 Gross hematuria: Secondary | ICD-10-CM | POA: Diagnosis not present

## 2023-12-08 DIAGNOSIS — E103312 Type 1 diabetes mellitus with moderate nonproliferative diabetic retinopathy with macular edema, left eye: Secondary | ICD-10-CM | POA: Diagnosis not present

## 2023-12-08 DIAGNOSIS — H04123 Dry eye syndrome of bilateral lacrimal glands: Secondary | ICD-10-CM | POA: Diagnosis not present

## 2023-12-08 DIAGNOSIS — H43811 Vitreous degeneration, right eye: Secondary | ICD-10-CM | POA: Diagnosis not present

## 2023-12-08 DIAGNOSIS — H353133 Nonexudative age-related macular degeneration, bilateral, advanced atrophic without subfoveal involvement: Secondary | ICD-10-CM | POA: Diagnosis not present

## 2023-12-08 DIAGNOSIS — E103291 Type 1 diabetes mellitus with mild nonproliferative diabetic retinopathy without macular edema, right eye: Secondary | ICD-10-CM | POA: Diagnosis not present

## 2023-12-08 DIAGNOSIS — H31013 Macula scars of posterior pole (postinflammatory) (post-traumatic), bilateral: Secondary | ICD-10-CM | POA: Diagnosis not present

## 2023-12-14 DIAGNOSIS — N3001 Acute cystitis with hematuria: Secondary | ICD-10-CM | POA: Diagnosis not present

## 2023-12-14 DIAGNOSIS — N3091 Cystitis, unspecified with hematuria: Secondary | ICD-10-CM | POA: Diagnosis not present

## 2024-01-04 DIAGNOSIS — R8289 Other abnormal findings on cytological and histological examination of urine: Secondary | ICD-10-CM | POA: Diagnosis not present

## 2024-01-05 DIAGNOSIS — R310 Gross hematuria: Secondary | ICD-10-CM | POA: Diagnosis not present

## 2024-01-05 DIAGNOSIS — Z8546 Personal history of malignant neoplasm of prostate: Secondary | ICD-10-CM | POA: Diagnosis not present

## 2024-01-11 DIAGNOSIS — E1029 Type 1 diabetes mellitus with other diabetic kidney complication: Secondary | ICD-10-CM | POA: Diagnosis not present

## 2024-01-11 DIAGNOSIS — N1831 Chronic kidney disease, stage 3a: Secondary | ICD-10-CM | POA: Diagnosis not present

## 2024-01-11 DIAGNOSIS — F411 Generalized anxiety disorder: Secondary | ICD-10-CM | POA: Diagnosis not present

## 2024-01-11 DIAGNOSIS — I5189 Other ill-defined heart diseases: Secondary | ICD-10-CM | POA: Diagnosis not present

## 2024-01-11 DIAGNOSIS — F330 Major depressive disorder, recurrent, mild: Secondary | ICD-10-CM | POA: Diagnosis not present

## 2024-01-11 DIAGNOSIS — I129 Hypertensive chronic kidney disease with stage 1 through stage 4 chronic kidney disease, or unspecified chronic kidney disease: Secondary | ICD-10-CM | POA: Diagnosis not present

## 2024-01-11 DIAGNOSIS — Z4681 Encounter for fitting and adjustment of insulin pump: Secondary | ICD-10-CM | POA: Diagnosis not present

## 2024-01-11 DIAGNOSIS — I679 Cerebrovascular disease, unspecified: Secondary | ICD-10-CM | POA: Diagnosis not present

## 2024-01-11 DIAGNOSIS — I739 Peripheral vascular disease, unspecified: Secondary | ICD-10-CM | POA: Diagnosis not present

## 2024-01-11 DIAGNOSIS — E039 Hypothyroidism, unspecified: Secondary | ICD-10-CM | POA: Diagnosis not present

## 2024-01-11 DIAGNOSIS — Z8546 Personal history of malignant neoplasm of prostate: Secondary | ICD-10-CM | POA: Diagnosis not present

## 2024-01-11 DIAGNOSIS — E785 Hyperlipidemia, unspecified: Secondary | ICD-10-CM | POA: Diagnosis not present

## 2024-01-12 ENCOUNTER — Other Ambulatory Visit: Payer: Self-pay | Admitting: Urology

## 2024-01-12 DIAGNOSIS — R310 Gross hematuria: Secondary | ICD-10-CM

## 2024-01-13 DIAGNOSIS — H43811 Vitreous degeneration, right eye: Secondary | ICD-10-CM | POA: Diagnosis not present

## 2024-01-13 DIAGNOSIS — H04123 Dry eye syndrome of bilateral lacrimal glands: Secondary | ICD-10-CM | POA: Diagnosis not present

## 2024-01-13 DIAGNOSIS — E103291 Type 1 diabetes mellitus with mild nonproliferative diabetic retinopathy without macular edema, right eye: Secondary | ICD-10-CM | POA: Diagnosis not present

## 2024-01-13 DIAGNOSIS — H31013 Macula scars of posterior pole (postinflammatory) (post-traumatic), bilateral: Secondary | ICD-10-CM | POA: Diagnosis not present

## 2024-01-13 DIAGNOSIS — H353133 Nonexudative age-related macular degeneration, bilateral, advanced atrophic without subfoveal involvement: Secondary | ICD-10-CM | POA: Diagnosis not present

## 2024-01-13 DIAGNOSIS — E103312 Type 1 diabetes mellitus with moderate nonproliferative diabetic retinopathy with macular edema, left eye: Secondary | ICD-10-CM | POA: Diagnosis not present

## 2024-02-09 ENCOUNTER — Ambulatory Visit
Admission: RE | Admit: 2024-02-09 | Discharge: 2024-02-09 | Disposition: A | Discharge status: Home / Self Care | Source: Ambulatory Visit | Attending: Urology | Admitting: Urology

## 2024-02-09 DIAGNOSIS — K8020 Calculus of gallbladder without cholecystitis without obstruction: Secondary | ICD-10-CM | POA: Diagnosis not present

## 2024-02-09 DIAGNOSIS — R310 Gross hematuria: Secondary | ICD-10-CM

## 2024-02-09 DIAGNOSIS — N1330 Unspecified hydronephrosis: Secondary | ICD-10-CM | POA: Diagnosis not present

## 2024-02-09 MED ORDER — GADOPICLENOL 0.5 MMOL/ML IV SOLN
9.0000 mL | Freq: Once | INTRAVENOUS | Status: AC | PRN
  Administered 2024-02-09: 9 mL via INTRAVENOUS

## 2024-02-15 ENCOUNTER — Other Ambulatory Visit: Payer: Self-pay | Admitting: Urology

## 2024-02-15 DIAGNOSIS — R310 Gross hematuria: Secondary | ICD-10-CM | POA: Diagnosis not present

## 2024-02-15 DIAGNOSIS — Z8546 Personal history of malignant neoplasm of prostate: Secondary | ICD-10-CM | POA: Diagnosis not present

## 2024-02-15 DIAGNOSIS — D414 Neoplasm of uncertain behavior of bladder: Secondary | ICD-10-CM | POA: Diagnosis not present

## 2024-02-23 ENCOUNTER — Other Ambulatory Visit: Payer: Self-pay

## 2024-02-23 ENCOUNTER — Other Ambulatory Visit: Payer: Self-pay | Admitting: Urology

## 2024-02-23 ENCOUNTER — Encounter (HOSPITAL_COMMUNITY)
Admission: RE | Admit: 2024-02-23 | Discharge: 2024-02-23 | Disposition: A | Discharge status: Home / Self Care | Source: Ambulatory Visit | Attending: Urology | Admitting: Urology

## 2024-02-23 ENCOUNTER — Encounter (HOSPITAL_COMMUNITY): Payer: Self-pay

## 2024-02-23 VITALS — BP 176/68 | HR 60 | Temp 98.5°F | Resp 16 | Ht 67.0 in | Wt 185.0 lb

## 2024-02-23 DIAGNOSIS — E109 Type 1 diabetes mellitus without complications: Secondary | ICD-10-CM | POA: Insufficient documentation

## 2024-02-23 DIAGNOSIS — Z01818 Encounter for other preprocedural examination: Secondary | ICD-10-CM | POA: Diagnosis not present

## 2024-02-23 DIAGNOSIS — E119 Type 2 diabetes mellitus without complications: Secondary | ICD-10-CM

## 2024-02-23 HISTORY — DX: Unspecified osteoarthritis, unspecified site: M19.90

## 2024-02-23 LAB — HEMOGLOBIN A1C
Hgb A1c MFr Bld: 6.8 % — ABNORMAL HIGH (ref 4.8–5.6)
Mean Plasma Glucose: 148.46 mg/dL

## 2024-02-23 LAB — BASIC METABOLIC PANEL WITH GFR
Anion gap: 11 (ref 5–15)
BUN: 32 mg/dL — ABNORMAL HIGH (ref 8–23)
CO2: 22 mmol/L (ref 22–32)
Calcium: 9.9 mg/dL (ref 8.9–10.3)
Chloride: 103 mmol/L (ref 98–111)
Creatinine, Ser: 2.55 mg/dL — ABNORMAL HIGH (ref 0.61–1.24)
GFR, Estimated: 25 mL/min — ABNORMAL LOW (ref >60)
Glucose, Bld: 183 mg/dL — ABNORMAL HIGH (ref 70–99)
Potassium: 5.9 mmol/L — ABNORMAL HIGH (ref 3.5–5.1)
Sodium: 136 mmol/L (ref 135–145)

## 2024-02-23 LAB — CBC
HCT: 44.7 % (ref 39.0–52.0)
Hemoglobin: 14.1 g/dL (ref 13.0–17.0)
MCH: 28.9 pg (ref 26.0–34.0)
MCHC: 31.5 g/dL (ref 30.0–36.0)
MCV: 91.6 fL (ref 80.0–100.0)
Platelets: 198 K/uL (ref 150–400)
RBC: 4.88 MIL/uL (ref 4.22–5.81)
RDW: 12.7 % (ref 11.5–15.5)
WBC: 6.6 K/uL (ref 4.0–10.5)
nRBC: 0 % (ref 0.0–0.2)

## 2024-02-23 LAB — GLUCOSE, CAPILLARY: Glucose-Capillary: 190 mg/dL — ABNORMAL HIGH (ref 70–99)

## 2024-02-23 NOTE — Patient Instructions (Addendum)
 SURGICAL WAITING ROOM VISITATION  Patients having surgery or a procedure may have no more than 2 support people in the waiting area - these visitors may rotate.    Children under the age of 58 must have an adult with them who is not the patient.  Visitors with respiratory illnesses are discouraged from visiting and should remain at home.  If the patient needs to stay at the hospital during part of their recovery, the visitor guidelines for inpatient rooms apply. Pre-op nurse will coordinate an appropriate time for 1 support person to accompany patient in pre-op.  This support person may not rotate.    Please refer to the [facility] website for the visitor guidelines for Inpatients (after your surgery is over and you are in a regular room).    Your procedure is scheduled on: 03/02/24   Report to [facility] Main Entrance    Report to admitting at 9:15 AM   Call this number if you have problems the morning of surgery [phone]   Do not eat food or drink liquids :After Midnight.           If you have questions, please contact your surgeon's office.   FOLLOW BOWEL PREP AND ANY ADDITIONAL PRE OP INSTRUCTIONS YOU RECEIVED FROM YOUR SURGEON'S OFFICE!!!     Oral Hygiene is also important to reduce your risk of infection.                                    Remember - BRUSH YOUR TEETH THE MORNING OF SURGERY WITH YOUR REGULAR TOOTHPASTE  DENTURES WILL BE REMOVED PRIOR TO SURGERY PLEASE DO NOT APPLY Poly grip OR ADHESIVES!!!   Stop all vitamins and herbal supplements 7 days before surgery.   Take these medicines the morning of surgery with A SIP OF WATER : Amlodipine , Atorvastatin , Citalopram , Levothyroxine, Omeprazole  DO NOT TAKE ANY ORAL DIABETIC MEDICATIONS DAY OF YOUR SURGERY  How to Manage Your Diabetes Before and After Surgery  Why is it important to control my blood sugar before and after surgery? Improving blood sugar levels before and after surgery helps healing  and can limit problems. A way of improving blood sugar control is eating a healthy diet by:  Eating less sugar and carbohydrates  Increasing activity/exercise  Talking with your doctor about reaching your blood sugar goals High blood sugars (greater than 180 mg/dL) can raise your risk of infections and slow your recovery, so you will need to focus on controlling your diabetes during the weeks before surgery. Make sure that the doctor who takes care of your diabetes knows about your planned surgery including the date and location.  How do I manage my blood sugar before surgery? Check your blood sugar at least 4 times a day, starting 2 days before surgery, to make sure that the level is not too high or low. Check your blood sugar the morning of your surgery when you wake up and every 2 hours until you get to the Short Stay unit. If your blood sugar is less than 70 mg/dL, you will need to treat for low blood sugar: Do not take insulin . Treat a low blood sugar (less than 70 mg/dL) with  cup of clear juice (cranberry or apple), 4 glucose tablets, OR glucose gel. Recheck blood sugar in 15 minutes after treatment (to make sure it is greater than 70 mg/dL). If your blood sugar is not  greater than 70 mg/dL on recheck, call [phone] for further instructions. Report your blood sugar to the short stay nurse when you get to Short Stay.  If you are admitted to the hospital after surgery: Your blood sugar will be checked by the staff and you will probably be given insulin  after surgery (instead of oral diabetes medicines) to make sure you have good blood sugar levels. The goal for blood sugar control after surgery is 80-180 mg/dL.   WHAT DO I DO ABOUT MY DIABETES MEDICATION?    For patients with insulin  pumps: Contact your diabetes doctor for specific instructions before surgery. Decrease basal rates by 20% at midnight the night before your surgery. Note that if your surgery is planned to be longer  than 2 hours, your insulin  pump will be removed and intravenous (IV) insulin  will be started and managed by the nurses and the anesthesiologist. You will be able to restart your insulin  pump once you are awake and able to manage it.  Make sure to bring insulin  pump supplies to the hospital with you in case the  site needs to be changed.   Reviewed and Endorsed by [facility] Patient Education Committee, August 2015  Bring CPAP mask and tubing day of surgery.                              You may not have any metal on your body including jewelry, and body piercing             Do not wear, lotions, powders, cologne, or deodorant             Men may shave face and neck.   Do not bring valuables to the hospital. [place] IS NOT             RESPONSIBLE   FOR VALUABLES.   Contacts, glasses, dentures or bridgework may not be worn into surgery.  DO NOT BRING YOUR HOME MEDICATIONS TO THE HOSPITAL. PHARMACY WILL DISPENSE MEDICATIONS LISTED ON YOUR MEDICATION LIST TO YOU DURING YOUR ADMISSION IN THE HOSPITAL!    Patients discharged on the day of surgery will not be allowed to drive home.  Someone NEEDS to stay with you for the first 24 hours after anesthesia.              Please read over the following fact sheets you were given: IF YOU HAVE QUESTIONS ABOUT YOUR PRE-OP INSTRUCTIONS PLEASE CALL [phone] [place].   If you received a COVID test during your pre-op visit  it is requested that you wear a mask when out in public, stay away from anyone that may not be feeling well and notify your surgeon if you develop symptoms. If you test positive for Covid or have been in contact with anyone that has tested positive in the last 10 days please notify you surgeon.    [facility] - Preparing for Surgery Before surgery, you can play an important role.  Because skin is not sterile, your skin needs to be as free of germs as possible.  You can reduce the number of germs on your skin by washing with CHG  (chlorahexidine gluconate) soap before surgery.  CHG is an antiseptic cleaner which kills germs and bonds with the skin to continue killing germs even after washing. Please DO NOT use if you have an allergy to CHG or antibacterial soaps.  If your skin becomes reddened/irritated stop using the  CHG and inform your nurse when you arrive at Short Stay. Do not shave (including legs and underarms) for at least 48 hours prior to the first CHG shower.  You may shave your face/neck.  Please follow these instructions carefully:  1.  Shower with CHG Soap the night before surgery and the  morning of surgery.  2.  If you choose to wash your hair, wash your hair first as usual with your normal  shampoo.  3.  After you shampoo, rinse your hair and body thoroughly to remove the shampoo.                             4.  Use CHG as you would any other liquid soap.  You can apply chg directly to the skin and wash.  Gently with a scrungie or clean washcloth.  5.  Apply the CHG Soap to your body ONLY FROM THE NECK DOWN.   Do   not use on face/ open                           Wound or open sores. Avoid contact with eyes, ears mouth and   genitals (private parts).                       Wash face,  Genitals (private parts) with your normal soap.             6.  Wash thoroughly, paying special attention to the area where your    surgery  will be performed.  7.  Thoroughly rinse your body with warm water  from the neck down.  8.  DO NOT shower/wash with your normal soap after using and rinsing off the CHG Soap.                9.  Pat yourself dry with a clean towel.            10.  Wear clean pajamas.            11.  Place clean sheets on your bed the night of your first shower and do not  sleep with pets. Day of Surgery : Do not apply any lotions/deodorants the morning of surgery.  Please wear clean clothes to the hospital/surgery center.  FAILURE TO FOLLOW THESE INSTRUCTIONS MAY RESULT IN THE CANCELLATION OF YOUR  SURGERY  PATIENT SIGNATURE_________________________________  NURSE SIGNATURE__________________________________  ________________________________________________________________________

## 2024-02-23 NOTE — Progress Notes (Addendum)
 COVID Vaccine Completed:  Date of COVID positive in last 90 days:  PCP - Garnette Ore, MD Cardiologist - Lamar Fitch, MD LOV 2016 for bradycardia   Chest x-ray - N/A EKG - 02/23/24 Epic/chart Stress Test - N/A ECHO - 05/02/19 Epic Cardiac Cath - n/a Pacemaker/ICD device last checked:N/A Spinal Cord Stimulator:N/A  Bowel Prep - N/A  Sleep Study - yes CPAP -  yes every night   Fasting Blood Sugar - N/A Checks Blood Sugar checks every 4 hours while awake. At bedtime 2300, and then not again until 0700  Last dose of GLP1 agonist-  N/A GLP1 instructions:  Do not take after     Last dose of SGLT-2 inhibitors-  N/A SGLT-2 instructions:  Do not take after     Blood Thinner Instructions: N/A Last dose:   Time: Aspirin  Instructions: ASA 81, hold 7 days Last Dose:  Activity level: Can go up a flight of stairs and perform activities of daily living without stopping and without symptoms of chest pain or shortness of breath.  Anesthesia review: HTN, DM1 with insulin  pump, CKD, carotid artery occlusion, K+ 5.9, creatinine 2.55  Patient denies shortness of breath, fever, cough and chest pain at PAT appointment  Patient verbalized understanding of instructions that were given to them at the PAT appointment. Patient was also instructed that they will need to review over the PAT instructions again at home before surgery.

## 2024-02-23 NOTE — Progress Notes (Signed)
 K+ 5.9, creatinine 2.55 results routed t oDr. Lovie

## 2024-02-24 DIAGNOSIS — E103312 Type 1 diabetes mellitus with moderate nonproliferative diabetic retinopathy with macular edema, left eye: Secondary | ICD-10-CM | POA: Diagnosis not present

## 2024-02-24 DIAGNOSIS — E103291 Type 1 diabetes mellitus with mild nonproliferative diabetic retinopathy without macular edema, right eye: Secondary | ICD-10-CM | POA: Diagnosis not present

## 2024-02-24 DIAGNOSIS — H43811 Vitreous degeneration, right eye: Secondary | ICD-10-CM | POA: Diagnosis not present

## 2024-02-24 DIAGNOSIS — H31013 Macula scars of posterior pole (postinflammatory) (post-traumatic), bilateral: Secondary | ICD-10-CM | POA: Diagnosis not present

## 2024-02-24 DIAGNOSIS — H04123 Dry eye syndrome of bilateral lacrimal glands: Secondary | ICD-10-CM | POA: Diagnosis not present

## 2024-02-24 DIAGNOSIS — H353133 Nonexudative age-related macular degeneration, bilateral, advanced atrophic without subfoveal involvement: Secondary | ICD-10-CM | POA: Diagnosis not present

## 2024-02-26 NOTE — Progress Notes (Incomplete)
 Patient's wife is ver concerned about his blood sugear morning of surgery. She wants t oknow if he can eat 4 hours before

## 2024-02-28 ENCOUNTER — Encounter (HOSPITAL_COMMUNITY): Payer: Self-pay

## 2024-02-28 ENCOUNTER — Other Ambulatory Visit: Payer: Self-pay

## 2024-02-28 ENCOUNTER — Emergency Department (HOSPITAL_COMMUNITY)
Admission: EM | Admit: 2024-02-28 | Discharge: 2024-02-28 | Disposition: A | Discharge status: Home / Self Care | Attending: Emergency Medicine | Admitting: Emergency Medicine

## 2024-02-28 DIAGNOSIS — Z8546 Personal history of malignant neoplasm of prostate: Secondary | ICD-10-CM | POA: Insufficient documentation

## 2024-02-28 DIAGNOSIS — R319 Hematuria, unspecified: Secondary | ICD-10-CM | POA: Diagnosis present

## 2024-02-28 DIAGNOSIS — Z794 Long term (current) use of insulin: Secondary | ICD-10-CM | POA: Diagnosis not present

## 2024-02-28 DIAGNOSIS — R310 Gross hematuria: Secondary | ICD-10-CM | POA: Diagnosis not present

## 2024-02-28 DIAGNOSIS — R108A3 Suprapubic tenderness: Secondary | ICD-10-CM | POA: Diagnosis not present

## 2024-02-28 DIAGNOSIS — Z7982 Long term (current) use of aspirin: Secondary | ICD-10-CM | POA: Insufficient documentation

## 2024-02-28 LAB — BASIC METABOLIC PANEL WITH GFR
Anion gap: 11 (ref 5–15)
BUN: 41 mg/dL — ABNORMAL HIGH (ref 8–23)
CO2: 22 mmol/L (ref 22–32)
Calcium: 9.3 mg/dL (ref 8.9–10.3)
Chloride: 102 mmol/L (ref 98–111)
Creatinine, Ser: 2.4 mg/dL — ABNORMAL HIGH (ref 0.61–1.24)
GFR, Estimated: 26 mL/min — ABNORMAL LOW (ref >60)
Glucose, Bld: 148 mg/dL — ABNORMAL HIGH (ref 70–99)
Potassium: 5 mmol/L (ref 3.5–5.1)
Sodium: 135 mmol/L (ref 135–145)

## 2024-02-28 LAB — URINALYSIS, ROUTINE W REFLEX MICROSCOPIC
Bilirubin Urine: NEGATIVE
Glucose, UA: 50 mg/dL — AB
Ketones, ur: NEGATIVE mg/dL
Leukocytes,Ua: NEGATIVE
Nitrite: NEGATIVE
Protein, ur: 100 mg/dL — AB
RBC / HPF: >50 RBC/hpf (ref 0–5)
Specific Gravity, Urine: 1.008 (ref 1.005–1.030)
pH: 6 (ref 5.0–8.0)

## 2024-02-28 LAB — CBC
HCT: 40.1 % (ref 39.0–52.0)
Hemoglobin: 13.1 g/dL (ref 13.0–17.0)
MCH: 29.7 pg (ref 26.0–34.0)
MCHC: 32.7 g/dL (ref 30.0–36.0)
MCV: 90.9 fL (ref 80.0–100.0)
Platelets: 167 K/uL (ref 150–400)
RBC: 4.41 MIL/uL (ref 4.22–5.81)
RDW: 12.7 % (ref 11.5–15.5)
WBC: 7.4 K/uL (ref 4.0–10.5)
nRBC: 0 % (ref 0.0–0.2)

## 2024-02-28 MED ORDER — SODIUM CHLORIDE 0.9 % IV BOLUS
1000.0000 mL | Freq: Once | INTRAVENOUS | Status: AC
  Administered 2024-02-28: 1000 mL via INTRAVENOUS

## 2024-02-28 MED ORDER — LIDOCAINE HCL URETHRAL/MUCOSAL 2 % EX GEL
1.0000 | Freq: Once | CUTANEOUS | Status: DC
  Filled 2024-02-28: qty 11

## 2024-02-28 NOTE — Discharge Instructions (Signed)
 As we discussed, you have hematuria likely from your bladder tumor  You need to stay hydrated.  Expect some clots in your urine and dark urine.  You need to see your urologist on Wednesday as scheduled for your procedure  Return to ER if you are unable to urinate or severe bladder spasms

## 2024-02-28 NOTE — ED Triage Notes (Signed)
 Pt came in for hematuria and urinary retention. Pt has urinated blood twice this morning and has not been able to urinate for the rest of the day. Pt is suppose to have a procedure done soon for a growth on his bladder.   Hx prostate cancer

## 2024-02-28 NOTE — ED Provider Notes (Signed)
 [place] EMERGENCY DEPARTMENT AT [facility] Provider Note   CSN: [id number] Arrival date & time: 02/28/24  1559     Patient presents with: Hematuria and Urinary Retention   Eric [name] is a 83 y.o. male history of bladder tumor, here presenting with hematuria.  Patient has hematuria for several months.  Patient was seen by urology and is scheduled for biopsy of the tumor.  Patient states that he noticed some blood clots in his urine today.  He states that he had some trouble urinating but was able to urinate in triage.   The history is provided by the patient.       Prior to Admission medications   Medication Sig Start Date End Date Taking? Authorizing Provider  amLODipine  (NORVASC ) 5 MG tablet Take 5 mg by mouth in the morning. 11/26/11   [provider]  aspirin  EC 81 MG tablet Take 81 mg by mouth in the morning.    [provider]  atorvastatin  (LIPITOR) 40 MG tablet Take 40 mg by mouth in the morning.    [provider]  citalopram  (CELEXA ) 20 MG tablet Take 20 mg by mouth in the morning.    [provider]  HUMALOG 100 UNIT/ML injection Inject into the skin as directed. Up to 115 units a day per sliding scale via insulin  pump    [provider]  Insulin  Human (INSULIN  PUMP) SOLN Inject into the skin. insulin  lispro (Humalog U-100 Insulin ) 100 unit/mL solution INJECT UP TO 115 UNITS A DAY PER SLIDING SCALE AS DIRECTED VIA INSULIN  PUMP    [provider]  levothyroxine (SYNTHROID) 75 MCG tablet Take 75 mcg by mouth daily before breakfast.    [provider]  mirtazapine (REMERON) 15 MG tablet Take 15 mg by mouth at bedtime.    [provider]  omeprazole (PRILOSEC) 40 MG capsule Take 40 mg by mouth daily before breakfast.    [provider]  tobramycin (TOBREX) 0.3 % ophthalmic solution Place 1 drop into both eyes as directed. INSTILL 1 DROP INTO AFFECTED EYE(S) THE DAY BEFORE, THE DAY  OF AND THE DAY AFTER EYE PROCEDURE. 02/10/24   [provider]    Allergies: Patient has no known allergies.    Review of Systems  Genitourinary:  Positive for hematuria.  All other systems reviewed and are negative.   Updated Vital Signs BP (!) 157/82 (BP Location: Left Arm)   Pulse 74   Temp 98.3 F (36.8 C) (Oral)   Resp 16   SpO2 99%   Physical Exam Vitals and nursing note reviewed.  HENT:     Head: Normocephalic.     Nose: Nose normal.     Mouth/Throat:     Mouth: Mucous membranes are moist.  Eyes:     Extraocular Movements: Extraocular movements intact.     Pupils: Pupils are equal, round, and reactive to light.  Cardiovascular:     Rate and Rhythm: Normal rate and regular rhythm.     Pulses: Normal pulses.     Heart sounds: Normal heart sounds.  Pulmonary:     Effort: Pulmonary effort is normal.     Breath sounds: Normal breath sounds.  Abdominal:     General: Abdomen is flat.     Palpations: Abdomen is soft.  Genitourinary:    Comments: Mild suprapubic tenderness Musculoskeletal:        General: Normal range of motion.     Cervical back: Normal range of motion  and neck supple.  Skin:    General: Skin is warm.     Capillary Refill: Capillary refill takes less than 2 seconds.  Neurological:     General: No focal deficit present.     Mental Status: He is alert and oriented to person, place, and time.  Psychiatric:        Mood and Affect: Mood normal.        Behavior: Behavior normal.     (all labs ordered are listed, but only abnormal results are displayed) Labs Reviewed  CBC  BASIC METABOLIC PANEL WITH GFR  URINALYSIS, ROUTINE W REFLEX MICROSCOPIC    EKG: None  Radiology: No results found.   Procedures   Medications Ordered in the ED  lidocaine  (XYLOCAINE ) 2 % jelly 1 Application (0 Applications Urethral Hold 02/28/24 1654)  sodium chloride  0.9 % bolus 1,000 mL (1,000 mLs Intravenous New Bag/Given 02/28/24 1650)                                     Medical Decision Making Eric [name] is a 83 y.o. male here presenting with hematuria.  Patient has a history of bladder tumor.  Patient is able to urinate I was able to perform bedside ultrasound and patient is not in retention.  Plan to get CBC BMP and urinalysis.  Will hydrate patient and reassess  7:10 PM I reviewed patient's labs and hemoglobin is stable at 13.  Creatinine is stable at 2.4.  UA showed blood and no obvious infection.  Patient urinated multiple times and urine has become progressively more clear.  Patient does have some clots but he is able to urinate.  Altogether he urinated around 1 L.  Since patient is able to urinate, I do not think he needs a Foley catheter right now.  He has bladder surgery on Wednesday as scheduled with urology.  Told him that if he is unable to urinate he needs to return to the ER to get a Foley catheter placed  Problems Addressed: Gross hematuria: acute illness or injury  Amount and/or Complexity of Data Reviewed Labs: ordered. Decision-making details documented in ED Course.      Final diagnoses:  None    ED Discharge Orders     None          Patt Alm Macho, MD 02/28/24 1911

## 2024-03-02 ENCOUNTER — Ambulatory Visit (HOSPITAL_BASED_OUTPATIENT_CLINIC_OR_DEPARTMENT_OTHER): Payer: Self-pay | Admitting: Certified Registered"

## 2024-03-02 ENCOUNTER — Encounter (HOSPITAL_COMMUNITY)
Admission: RE | Disposition: A | Discharge status: Home / Self Care | Payer: Self-pay | Source: Home / Self Care | Attending: Urology

## 2024-03-02 ENCOUNTER — Ambulatory Visit (HOSPITAL_COMMUNITY)
Admission: RE | Admit: 2024-03-02 | Discharge: 2024-03-02 | Disposition: A | Discharge status: Home / Self Care | Attending: Urology | Admitting: Urology

## 2024-03-02 ENCOUNTER — Other Ambulatory Visit: Payer: Self-pay

## 2024-03-02 ENCOUNTER — Encounter (HOSPITAL_COMMUNITY): Payer: Self-pay | Admitting: Urology

## 2024-03-02 ENCOUNTER — Ambulatory Visit (HOSPITAL_COMMUNITY): Payer: Self-pay | Admitting: Medical

## 2024-03-02 ENCOUNTER — Ambulatory Visit (HOSPITAL_COMMUNITY)

## 2024-03-02 DIAGNOSIS — I10 Essential (primary) hypertension: Secondary | ICD-10-CM | POA: Diagnosis not present

## 2024-03-02 DIAGNOSIS — Z833 Family history of diabetes mellitus: Secondary | ICD-10-CM | POA: Insufficient documentation

## 2024-03-02 DIAGNOSIS — N3289 Other specified disorders of bladder: Secondary | ICD-10-CM | POA: Diagnosis not present

## 2024-03-02 DIAGNOSIS — R319 Hematuria, unspecified: Secondary | ICD-10-CM | POA: Insufficient documentation

## 2024-03-02 DIAGNOSIS — D494 Neoplasm of unspecified behavior of bladder: Secondary | ICD-10-CM | POA: Diagnosis not present

## 2024-03-02 DIAGNOSIS — C675 Malignant neoplasm of bladder neck: Secondary | ICD-10-CM | POA: Diagnosis not present

## 2024-03-02 DIAGNOSIS — Z79899 Other long term (current) drug therapy: Secondary | ICD-10-CM | POA: Diagnosis not present

## 2024-03-02 DIAGNOSIS — Z794 Long term (current) use of insulin: Secondary | ICD-10-CM | POA: Insufficient documentation

## 2024-03-02 DIAGNOSIS — E109 Type 1 diabetes mellitus without complications: Secondary | ICD-10-CM | POA: Diagnosis not present

## 2024-03-02 DIAGNOSIS — Z79890 Hormone replacement therapy (postmenopausal): Secondary | ICD-10-CM | POA: Diagnosis not present

## 2024-03-02 DIAGNOSIS — E785 Hyperlipidemia, unspecified: Secondary | ICD-10-CM | POA: Diagnosis not present

## 2024-03-02 DIAGNOSIS — C662 Malignant neoplasm of left ureter: Secondary | ICD-10-CM | POA: Insufficient documentation

## 2024-03-02 DIAGNOSIS — N329 Bladder disorder, unspecified: Secondary | ICD-10-CM | POA: Diagnosis not present

## 2024-03-02 DIAGNOSIS — F419 Anxiety disorder, unspecified: Secondary | ICD-10-CM

## 2024-03-02 DIAGNOSIS — E119 Type 2 diabetes mellitus without complications: Secondary | ICD-10-CM

## 2024-03-02 DIAGNOSIS — D090 Carcinoma in situ of bladder: Secondary | ICD-10-CM | POA: Diagnosis not present

## 2024-03-02 DIAGNOSIS — D414 Neoplasm of uncertain behavior of bladder: Secondary | ICD-10-CM | POA: Diagnosis present

## 2024-03-02 DIAGNOSIS — K219 Gastro-esophageal reflux disease without esophagitis: Secondary | ICD-10-CM | POA: Insufficient documentation

## 2024-03-02 DIAGNOSIS — R310 Gross hematuria: Secondary | ICD-10-CM | POA: Diagnosis not present

## 2024-03-02 HISTORY — PX: URETEROSCOPY: SHX842

## 2024-03-02 HISTORY — PX: TRANSURETHRAL RESECTION OF BLADDER TUMOR: SHX2575

## 2024-03-02 LAB — BASIC METABOLIC PANEL WITH GFR
Anion gap: 12 (ref 5–15)
BUN: 33 mg/dL — ABNORMAL HIGH (ref 8–23)
CO2: 19 mmol/L — ABNORMAL LOW (ref 22–32)
Calcium: 9.6 mg/dL (ref 8.9–10.3)
Chloride: 104 mmol/L (ref 98–111)
Creatinine, Ser: 2.24 mg/dL — ABNORMAL HIGH (ref 0.61–1.24)
GFR, Estimated: 29 mL/min — ABNORMAL LOW (ref >60)
Glucose, Bld: 185 mg/dL — ABNORMAL HIGH (ref 70–99)
Potassium: 4.3 mmol/L (ref 3.5–5.1)
Sodium: 135 mmol/L (ref 135–145)

## 2024-03-02 LAB — POCT I-STAT, CHEM 8
BUN: 36 mg/dL — ABNORMAL HIGH (ref 8–23)
Calcium, Ion: 1.26 mmol/L (ref 1.15–1.40)
Chloride: 108 mmol/L (ref 98–111)
Creatinine, Ser: 2.3 mg/dL — ABNORMAL HIGH (ref 0.61–1.24)
Glucose, Bld: 171 mg/dL — ABNORMAL HIGH (ref 70–99)
HCT: 37 % — ABNORMAL LOW (ref 39.0–52.0)
Hemoglobin: 12.6 g/dL — ABNORMAL LOW (ref 13.0–17.0)
Potassium: 4.5 mmol/L (ref 3.5–5.1)
Sodium: 140 mmol/L (ref 135–145)
TCO2: 21 mmol/L — ABNORMAL LOW (ref 22–32)

## 2024-03-02 LAB — GLUCOSE, CAPILLARY
Glucose-Capillary: 162 mg/dL — ABNORMAL HIGH (ref 70–99)
Glucose-Capillary: 185 mg/dL — ABNORMAL HIGH (ref 70–99)
Glucose-Capillary: 201 mg/dL — ABNORMAL HIGH (ref 70–99)

## 2024-03-02 SURGERY — URETEROSCOPY
Anesthesia: General

## 2024-03-02 MED ORDER — GLYCOPYRROLATE 0.2 MG/ML IJ SOLN
INTRAMUSCULAR | Status: AC
  Filled 2024-03-02: qty 1

## 2024-03-02 MED ORDER — ONDANSETRON HCL 4 MG/2ML IJ SOLN
INTRAMUSCULAR | Status: AC
  Filled 2024-03-02: qty 2

## 2024-03-02 MED ORDER — LIDOCAINE HCL (CARDIAC) PF 100 MG/5ML IV SOSY
PREFILLED_SYRINGE | INTRAVENOUS | Status: DC | PRN
  Administered 2024-03-02: 40 mg via INTRAVENOUS
  Administered 2024-03-02: 60 mg via INTRAVENOUS

## 2024-03-02 MED ORDER — SODIUM CHLORIDE 0.9 % IR SOLN
Status: DC | PRN
  Administered 2024-03-02: 3000 mL via INTRAVESICAL

## 2024-03-02 MED ORDER — EPHEDRINE SULFATE-NACL 50-0.9 MG/10ML-% IV SOSY
PREFILLED_SYRINGE | INTRAVENOUS | Status: DC | PRN
  Administered 2024-03-02 (×2): 5 mg via INTRAVENOUS

## 2024-03-02 MED ORDER — DROPERIDOL 2.5 MG/ML IJ SOLN: 0.6250 mg | Freq: Once | INTRAMUSCULAR | Status: DC | PRN

## 2024-03-02 MED ORDER — GLYCOPYRROLATE 0.2 MG/ML IJ SOLN
INTRAMUSCULAR | Status: DC | PRN
  Administered 2024-03-02: .2 mg via INTRAVENOUS

## 2024-03-02 MED ORDER — INSULIN ASPART 100 UNIT/ML IJ SOLN: 0.0000 [IU] | INTRAMUSCULAR | Status: DC | PRN

## 2024-03-02 MED ORDER — FENTANYL CITRATE (PF) 100 MCG/2ML IJ SOLN
INTRAMUSCULAR | Status: DC | PRN
  Administered 2024-03-02: 25 ug via INTRAVENOUS
  Administered 2024-03-02: 50 ug via INTRAVENOUS
  Administered 2024-03-02: 25 ug via INTRAVENOUS

## 2024-03-02 MED ORDER — DEXAMETHASONE SODIUM PHOSPHATE 10 MG/ML IJ SOLN
INTRAMUSCULAR | Status: AC
  Filled 2024-03-02: qty 1

## 2024-03-02 MED ORDER — FENTANYL CITRATE PF 50 MCG/ML IJ SOSY
PREFILLED_SYRINGE | INTRAMUSCULAR | Status: AC
  Filled 2024-03-02: qty 2

## 2024-03-02 MED ORDER — PROPOFOL 10 MG/ML IV BOLUS
INTRAVENOUS | Status: DC | PRN
Start: 2024-03-02 — End: 2024-03-02
  Administered 2024-03-02: 150 mg via INTRAVENOUS

## 2024-03-02 MED ORDER — ONDANSETRON HCL 4 MG/2ML IJ SOLN
INTRAMUSCULAR | Status: DC | PRN
Start: 2024-03-02 — End: 2024-03-02
  Administered 2024-03-02: 4 mg via INTRAVENOUS

## 2024-03-02 MED ORDER — ORAL CARE MOUTH RINSE: 15.0000 mL | Freq: Once | OROMUCOSAL | Status: AC

## 2024-03-02 MED ORDER — IOHEXOL 300 MG/ML  SOLN
INTRAMUSCULAR | Status: DC | PRN
  Administered 2024-03-02: 8 mL

## 2024-03-02 MED ORDER — PROPOFOL 10 MG/ML IV BOLUS
INTRAVENOUS | Status: AC
  Filled 2024-03-02: qty 20

## 2024-03-02 MED ORDER — CIPROFLOXACIN IN D5W 400 MG/200ML IV SOLN
400.0000 mg | INTRAVENOUS | Status: AC
Start: 2024-03-02 — End: 2024-03-02
  Administered 2024-03-02: 400 mg via INTRAVENOUS
  Filled 2024-03-02: qty 200

## 2024-03-02 MED ORDER — FENTANYL CITRATE (PF) 100 MCG/2ML IJ SOLN
INTRAMUSCULAR | Status: AC
  Filled 2024-03-02: qty 2

## 2024-03-02 MED ORDER — STERILE WATER FOR IRRIGATION IR SOLN
Status: DC | PRN
  Administered 2024-03-02: 3000 mL via INTRAVESICAL

## 2024-03-02 MED ORDER — OXYCODONE HCL 5 MG/5ML PO SOLN: 5.0000 mg | Freq: Once | ORAL | Status: DC | PRN

## 2024-03-02 MED ORDER — TRANEXAMIC ACID-NACL 1000-0.7 MG/100ML-% IV SOLN
INTRAVENOUS | Status: AC
  Filled 2024-03-02: qty 100

## 2024-03-02 MED ORDER — DEXAMETHASONE SODIUM PHOSPHATE 10 MG/ML IJ SOLN
INTRAMUSCULAR | Status: DC | PRN
  Administered 2024-03-02: 4 mg via INTRAVENOUS

## 2024-03-02 MED ORDER — ACETAMINOPHEN 10 MG/ML IV SOLN: 1000.0000 mg | Freq: Once | INTRAVENOUS | Status: DC | PRN

## 2024-03-02 MED ORDER — TRAMADOL HCL 50 MG PO TABS: 50.0000 mg | ORAL_TABLET | Freq: Four times a day (QID) | ORAL | 0 refills | Status: AC | PRN

## 2024-03-02 MED ORDER — LACTATED RINGERS IV SOLN: INTRAVENOUS | Status: DC

## 2024-03-02 MED ORDER — TRANEXAMIC ACID-NACL 1000-0.7 MG/100ML-% IV SOLN
INTRAVENOUS | Status: DC | PRN
  Administered 2024-03-02: 1000 mg via INTRAVENOUS

## 2024-03-02 MED ORDER — FENTANYL CITRATE PF 50 MCG/ML IJ SOSY
25.0000 ug | PREFILLED_SYRINGE | INTRAMUSCULAR | Status: DC | PRN
  Administered 2024-03-02: 50 ug via INTRAVENOUS

## 2024-03-02 MED ORDER — LIDOCAINE HCL (PF) 2 % IJ SOLN
INTRAMUSCULAR | Status: AC
  Filled 2024-03-02: qty 5

## 2024-03-02 MED ORDER — EPHEDRINE 5 MG/ML INJ
INTRAVENOUS | Status: AC
  Filled 2024-03-02: qty 5

## 2024-03-02 MED ORDER — OXYCODONE HCL 5 MG PO TABS: 5.0000 mg | ORAL_TABLET | Freq: Once | ORAL | Status: DC | PRN

## 2024-03-02 MED ORDER — SODIUM CHLORIDE 0.9 % IV SOLN: INTRAVENOUS | Status: DC

## 2024-03-02 MED ORDER — CHLORHEXIDINE GLUCONATE 0.12 % MT SOLN
15.0000 mL | Freq: Once | OROMUCOSAL | Status: AC
  Administered 2024-03-02: 15 mL via OROMUCOSAL

## 2024-03-02 MED ORDER — CEPHALEXIN 500 MG PO CAPS: 500.0000 mg | ORAL_CAPSULE | Freq: Two times a day (BID) | ORAL | 0 refills | Status: DC

## 2024-03-02 SURGICAL SUPPLY — 26 items
BAG COUNTER SPONGE SURGICOUNT (BAG) IMPLANT
BAG URINE DRAIN 2000ML AR STRL (UROLOGICAL SUPPLIES) IMPLANT
BAG URO CATCHER STRL LF (MISCELLANEOUS) ×2 IMPLANT
BASKET ZERO TIP NITINOL 2.4FR (BASKET) IMPLANT
CATH HEMA 3WAY 30CC 22FR COUDE (CATHETERS) IMPLANT
CATH URETERAL DUAL LUMEN 10F (MISCELLANEOUS) ×2 IMPLANT
CATH URETL OPEN END 6FR 70 (CATHETERS) IMPLANT
CLOTH BEACON ORANGE TIMEOUT ST (SAFETY) ×2 IMPLANT
DRAPE FOOT SWITCH (DRAPES) ×2 IMPLANT
ELECT REM PT RETURN 15FT ADLT (MISCELLANEOUS) ×2 IMPLANT
GLOVE SS BIOGEL STRL SZ 7 (GLOVE) ×2 IMPLANT
GOWN STRL REUS W/ TWL XL LVL3 (GOWN DISPOSABLE) ×2 IMPLANT
GUIDEWIRE STR DUAL SENSOR (WIRE) IMPLANT
GUIDEWIRE ZIPWRE .038 STRAIGHT (WIRE) ×2 IMPLANT
IV NS 1000ML BAXH (IV SOLUTION) ×2 IMPLANT
KIT TURNOVER KIT A (KITS) ×2 IMPLANT
LOOP CUT BIPOLAR 24F LRG (ELECTROSURGICAL) IMPLANT
MANIFOLD NEPTUNE II (INSTRUMENTS) ×2 IMPLANT
PACK CYSTO (CUSTOM PROCEDURE TRAY) ×2 IMPLANT
SHEATH NAVIGATOR HD 11/13X28 (SHEATH) IMPLANT
SHEATH NAVIGATOR HD 11/13X36 (SHEATH) IMPLANT
STENT URET 6FRX26 CONTOUR (STENTS) IMPLANT
SYRINGE TOOMEY IRRIG 70ML (MISCELLANEOUS) IMPLANT
TRACTIP FLEXIVA PULS ID 200XHI (Laser) IMPLANT
TUBING CONNECTING 10 (TUBING) ×2 IMPLANT
TUBING UROLOGY SET (TUBING) ×2 IMPLANT

## 2024-03-02 NOTE — Discharge Instructions (Signed)
 [facility] [phone] Post Ureteroscopy With or Without Stent Instructions  Definitions:  Ureter: The duct that transports urine from the kidney to the bladder. Stent:   A plastic hollow tube that is placed into the ureter, from the kidney to the bladder to prevent the ureter from swelling shut.  GENERAL INSTRUCTIONS:  Despite the fact that no skin incisions were used, the area around the ureter and bladder is raw and irritated. The stent is a foreign body which will further irritate the bladder wall. This irritation is manifested by increased frequency of urination, both day and night, and by an increase in the urge to urinate. In some, the urge to urinate is present almost always. Sometimes the urge is strong enough that you may not be able to stop yourself from urinating. The only real cure is to remove the stent and then give time for the bladder wall to heal which can't be done until the danger of the ureter swelling shut has passed, which varies.  You may see some blood in your urine while the stent is in place and a few days afterwards. Do not be alarmed, even if the urine was clear for a while. Get off your feet and drink lots of fluids until clearing occurs. If you start to pass clots or don't improve, call us .  DIET: You may return to your normal diet immediately. Because of the raw surface of your bladder, alcohol , spicy foods, acid type foods and drinks with caffeine may cause irritation or frequency and should be used in moderation. To keep your urine flowing freely and to avoid constipation, drink plenty of fluids during the day ( 8-10 glasses ). Tip: Avoid cranberry juice because it is very acidic.  ACTIVITY: Your physical activity doesn't need to be restricted. However, if you are very active, you may see some blood in your urine. We suggest that you reduce your activity under these circumstances until the bleeding has stopped.  BOWELS: It is important to  keep your bowels regular during the postoperative period. Straining with bowel movements can cause bleeding. A bowel movement every other day is reasonable. Use a mild laxative if needed, such as Milk of Magnesia 2-3 tablespoons, or 2 Dulcolax tablets. Call if you continue to have problems. If you have been taking narcotics for pain, before, during or after your surgery, you may be constipated. Take a laxative if necessary.   MEDICATION: You should resume your pre-surgery medications unless told not to. In addition you will often be given an antibiotic to prevent infection and likely several as needed medications for stent related discomfort. These should be taken as prescribed until the bottles are finished unless you are having an unusual reaction to one of the drugs.  PROBLEMS YOU SHOULD REPORT TO US : Fevers over 100.5 Fahrenheit. Heavy bleeding, or clots ( See above notes about blood in urine ). Inability to urinate. Drug reactions ( hives, rash, nausea, vomiting, diarrhea ). Severe burning or pain with urination that is not improving.     Transurethral Resection of Bladder Tumor (TURBT) or Bladder Biopsy   Definition:  Transurethral Resection of the Bladder Tumor is a surgical procedure used to diagnose and remove tumors within the bladder. TURBT is the most common treatment for early stage bladder cancer.  General instructions:     Your recent bladder surgery requires very little post hospital care but some definite precautions.  Despite the fact that no skin incisions were used, the area around the  bladder incisions are raw and covered with scabs to promote healing and prevent bleeding. Certain precautions are needed to insure that the scabs are not disturbed over the next 2-4 weeks while the healing proceeds.  Because the raw surface inside your bladder and the irritating effects of urine you may expect frequency of urination and/or urgency (a stronger desire to urinate) and  perhaps even getting up at night more often. This will usually resolve or improve slowly over the healing period. You may see some blood in your urine over the first 6 weeks. Do not be alarmed, even if the urine was clear for a while. Get off your feet and drink lots of fluids until clearing occurs. If you start to pass clots or don't improve call us .  Diet:  You may return to your normal diet immediately. Because of the raw surface of your bladder, alcohol , spicy foods, foods high in acid and drinks with caffeine may cause irritation or frequency and should be used in moderation. To keep your urine flowing freely and avoid constipation, drink plenty of fluids during the day (8-10 glasses). Tip: Avoid cranberry juice because it is very acidic.  Activity:  Your physical activity doesn't need to be restricted. However, if you are very active, you may see some blood in the urine. We suggest that you reduce your activity under the circumstances until the bleeding has stopped.  Bowels:  It is important to keep your bowels regular during the postoperative period. Straining with bowel movements can cause bleeding. A bowel movement every other day is reasonable. Use a mild laxative if needed, such as milk of magnesia 2-3 tablespoons, or 2 Dulcolax tablets. Call if you continue to have problems. If you had been taking narcotics for pain, before, during or after your surgery, you may be constipated. Take a laxative if necessary.    Medication:  You should resume your pre-surgery medications unless told not to. In addition you may be given an antibiotic to prevent or treat infection. Antibiotics are not always necessary. All medication should be taken as prescribed until the bottles are finished unless you are having an unusual reaction to one of the drugs.

## 2024-03-02 NOTE — Anesthesia Preprocedure Evaluation (Addendum)
 Anesthesia Evaluation  Patient identified by MRN, date of birth, ID band Patient awake    Reviewed: Allergy & Precautions, NPO status , Patient's Chart, lab work & pertinent test results  Airway Mallampati: II  TM Distance: >3 FB Neck ROM: Full    Dental  (+) Partial Upper, Dental Advisory Given   Pulmonary neg pulmonary ROS   breath sounds clear to auscultation       Cardiovascular hypertension, Pt. on medications  Rhythm:Regular Rate:Normal     Neuro/Psych   Anxiety     negative neurological ROS     GI/Hepatic Neg liver ROS,GERD  Medicated,,  Endo/Other  diabetes, Type 1, Insulin  Dependent    Renal/GU negative Renal ROS     Musculoskeletal   Abdominal   Peds  Hematology   Anesthesia Other Findings   Reproductive/Obstetrics                              Anesthesia Physical Anesthesia Plan  ASA: 3  Anesthesia Plan: General   Post-op Pain Management: Minimal or no pain anticipated   Induction: Intravenous  PONV Risk Score and Plan: 3 and Ondansetron , Dexamethasone and Midazolam   Airway Management Planned: LMA  Additional Equipment: None  Intra-op Plan:   Post-operative Plan: Extubation in OR  Informed Consent: I have reviewed the patients History and Physical, chart, labs and discussed the procedure including the risks, benefits and alternatives for the proposed anesthesia with the patient or authorized representative who has indicated his/her understanding and acceptance.       Plan Discussed with: CRNA  Anesthesia Plan Comments:          Anesthesia Quick Evaluation

## 2024-03-02 NOTE — Progress Notes (Signed)
 Dr. Tilford to bedside to evaluate pre-anesthesia. Patient wears insulin  pump w/o continuous glucometer reading. Most recent CBG on arrival - 162. Patient placed on sensitive scale for CBG checks. Plan is to allow patient to continue wearing monitor w/o change in rate or a turn off of pump. BMP sent. Plan of care communicated to CRNA.

## 2024-03-02 NOTE — Op Note (Signed)
 Preoperative diagnosis:  1. Left ureteral mass 2. Bladder mass 3. hematuria  Postoperative diagnosis: 1. same  Procedure(s): 1. Diagnostic ureteroscopy (left) 2. TURBT small (0.5 cm) 3. Left ureteral stent placement (6x26 cm)  Surgeon: Dr. Herlene Foot  Anesthesia: general  Complications: none  EBL: 20 cc  Specimens: Left ureteral mass Bladder mass (right bladder neck)  Disposition of specimens: path  Intraoperative findings:  circumferential papillary tumor extending at least a few cm in the distal left ureter Small papillary tumor just inside right bladder neck (0.5-0.7 cm)  Left retrograde pyelogram-notable filling defect in left distal ureter, marked hydronephrosis proximal to this.  Indication: see above diagnoses  Description of procedure:  With appropriate consent obtained the patient was brought to the operating room where anesthesia was induced.  He was prepped and draped in the usual sterile fashion a dorsal lithotomy position.  Timeout was performed.  I began by carefully inserting the 21 French rigid cystoscope.  Patient's veru and prostate were surgically absent consistent with his history of prostatectomy.  I then carefully examined his bladder.  Findings were similar to his outpatient cystoscopy-there was a small papillary tumor just inside the right bladder neck and there was blood oozing from the left ureteral orifice.  I carefully cannulated the left ureteral orifice with a 5 [place] open-ended and injected contrast.  There was a notable filling defect just inside the UVJ and marked hydro beyond this.  We then inserted a Glidewire up the left ureter orifice to the collecting system.  The ureteral catheter was removed and in the process of doing so papillary tumor material came out of the left UO.  This was collected to send for pathology.  We then carefully inserted the semirigid ureteroscope.  Once in the left UO a few centimeters, there was circumferential  growth of papillary tumor extending at least a few centimeters proximal.  I cannot identify the true lumen of the ureter and could not advance the ureteroscope proximal to this despite a few gentle attempts.  I then withdrew the semirigid ureteroscope.  The cystoscope was then introduced over a wire and used to place a 6 x 26 cm left ureteral stent.  There was brisk return of old appearing blood.  This seemed to taper off as the case progressed.  We then directed our attention to the bladder tumor.  Cold cup forceps were used to completely resect the papillary lesion.  The Bugbee was used to fulgurate this area.  I emptied the bladder and withdrew the cystoscope.  To be safe I inserted a 22 [place] three-way catheter inflated balloon with 20 cc sterile water .  I hand irrigated and the return was consistently light pink.  Subsequently I removed the catheter  Patient tolerated procedure well was brought to the PACU in stable condition  Will follow up pathology, and likely refer to once of my partners to discuss further management if path confirms malignancy.    Herlene Foot MD 03/02/2024, 1:15 PM  [facility]  Pager: [phone]

## 2024-03-02 NOTE — Anesthesia Procedure Notes (Signed)
 Procedure Name: LMA Insertion Date/Time: 03/02/2024 12:18 PM  Performed by: Metta Andrea NOVAK, CRNAPre-anesthesia Checklist: Patient identified, Emergency Drugs available, Suction available, Patient being monitored and Timeout performed Patient Re-evaluated:Patient Re-evaluated prior to induction Oxygen  Delivery Method: Circle system utilized Preoxygenation: Pre-oxygenation with 100% oxygen  Induction Type: IV induction Ventilation: Mask ventilation without difficulty LMA: LMA inserted and LMA with gastric port inserted LMA Size: 4.0 Number of attempts: 1 Tube secured with: Tape Dental Injury: Teeth and Oropharynx as per pre-operative assessment

## 2024-03-02 NOTE — Anesthesia Postprocedure Evaluation (Signed)
 Anesthesia Post Note  Patient: Eric [name]  Procedure(s) Performed: DIAGNOSTIC URETEROSCOPY/STENT PLACEMENT (Left) TURBT (TRANSURETHRAL RESECTION OF BLADDER TUMOR)     Patient location during evaluation: PACU Anesthesia Type: General Level of consciousness: awake and alert Pain management: pain level controlled Vital Signs Assessment: post-procedure vital signs reviewed and stable Respiratory status: spontaneous breathing, nonlabored ventilation, respiratory function stable and patient connected to nasal cannula oxygen  Cardiovascular status: blood pressure returned to baseline and stable Postop Assessment: no apparent nausea or vomiting Anesthetic complications: no   No notable events documented.  Last Vitals:  Vitals:   03/02/24 1500 03/02/24 1520  BP: (!) 191/66 (!) 173/64  Pulse: 61 70  Resp: 12 16  Temp:  36.8 C  SpO2: 93% 96%    Last Pain:  Vitals:   03/02/24 1520  TempSrc:   PainSc: 3                  Franky JONETTA Bald

## 2024-03-02 NOTE — Transfer of Care (Signed)
 Immediate Anesthesia Transfer of Care Note  Patient: Eric [name]  Procedure(s) Performed: DIAGNOSTIC URETEROSCOPY/STENT PLACEMENT (Left) TURBT (TRANSURETHRAL RESECTION OF BLADDER TUMOR)  Patient Location: PACU  Anesthesia Type:General  Level of Consciousness: awake, alert , and patient cooperative  Airway & Oxygen  Therapy: Patient Spontanous Breathing and Patient connected to face mask oxygen   Post-op Assessment: Report given to RN and Post -op Vital signs reviewed and stable  Post vital signs: Reviewed and stable  Last Vitals:  Vitals Value Taken Time  BP 193/72 03/02/24 13:41  Temp    Pulse 69 03/02/24 13:44  Resp 11 03/02/24 13:44  SpO2 100 % 03/02/24 13:44  Vitals shown include unfiled device data.  Last Pain:  Vitals:   03/02/24 0936  TempSrc:   PainSc: 0-No pain         Complications: No notable events documented.

## 2024-03-02 NOTE — H&P (Addendum)
 H&P  History of Present Illness: [name] is a 83 y.o. year old M who presents today for diagnostic ureteroscopy to evaluate a possible mass, biopsy of bladder lesion.   No acute complaints  Past Medical History:  Diagnosis Date   Anxiety    Arthritis    Diabetes mellitus    insulin  pump   Hearing problem    History of blurred vision    Hyperlipidemia    Hypertension    Impotence of organic origin    Malignant neoplasm prostate (HCC)    Nocturia    Right shoulder pain    due to prior surgery   Shoulder injury    continues to have occ pain   Snoring disorder    loud  per wife.    Stress incontinence, male    Type I diabetes mellitus (HCC)    Ventral hernia     Past Surgical History:  Procedure Laterality Date   CYSTOSCOPY  01/06/2012   Procedure: CYSTOSCOPY FLEXIBLE;  Surgeon: Arlena LILLETTE Gal, MD;  Location: [facility] OR;  Service: Urology;  Laterality: N/A;   CYSTOSCOPY WITH URETHRAL DILATATION  01/06/2012   Procedure: CYSTOSCOPY WITH URETHRAL DILATATION;  Surgeon: Arlena LILLETTE Gal, MD;  Location: [facility] OR;  Service: Urology;  Laterality: N/A;  insertion of foley catheter   EYE SURGERY  04/28/2011   macular pucker   HERNIA REPAIR     PROSTATE SURGERY  02/23/2010   ROTATOR CUFF REPAIR Right    VENTRAL HERNIA REPAIR  01/06/2012   Procedure: LAPAROSCOPIC VENTRAL HERNIA;  Surgeon: Deward GORMAN Null III, MD;  Location: [facility] OR;  Service: General;  Laterality: N/A;    Home Medications:  Current Meds  Medication Sig   amLODipine  (NORVASC ) 5 MG tablet Take 5 mg by mouth in the morning.   aspirin  EC 81 MG tablet Take 81 mg by mouth in the morning.   atorvastatin  (LIPITOR) 40 MG tablet Take 40 mg by mouth in the morning.   citalopram  (CELEXA ) 20 MG tablet Take 20 mg by mouth in the morning.   HUMALOG 100 UNIT/ML injection Inject into the skin as directed. Up to 115 units a day per sliding scale via insulin  pump   Insulin  Human (INSULIN  PUMP) SOLN Inject into the skin.  insulin  lispro (Humalog U-100 Insulin ) 100 unit/mL solution INJECT UP TO 115 UNITS A DAY PER SLIDING SCALE AS DIRECTED VIA INSULIN  PUMP   levothyroxine (SYNTHROID) 75 MCG tablet Take 75 mcg by mouth daily before breakfast.   mirtazapine (REMERON) 15 MG tablet Take 15 mg by mouth at bedtime.   omeprazole (PRILOSEC) 40 MG capsule Take 40 mg by mouth daily before breakfast.    Allergies: No Known Allergies  Family History  Problem Relation Age of Onset   Stroke Mother    Heart disease Father    Cancer Unknown    Heart disease Unknown    Diabetes Unknown     Social History:  reports that he has never smoked. He has never used smokeless tobacco. He reports that he does not drink alcohol  and does not use drugs.  ROS: A complete review of systems was performed.  All systems are negative except for pertinent findings as noted.  Physical Exam:  Vital signs in last 24 hours: Temp:  [98.2 F (36.8 C)] 98.2 F (36.8 C) (10/08 0920) Pulse Rate:  [84] 84 (10/08 0920) Resp:  [18] 18 (10/08 0920) BP: (193)/(72) 193/72 (10/08 0920) SpO2:  [98 %] 98 % (10/08 0920)  Weight:  [83 kg] 83 kg (10/08 0936) Constitutional:  Alert and oriented, No acute distress Cardiovascular: Regular rate and rhythm Respiratory: Normal respiratory effort, Lungs clear bilaterally GI: Abdomen is soft, nontender, nondistended, no abdominal masses Lymphatic: No lymphadenopathy Neurologic: Grossly intact, no focal deficits Psychiatric: Normal mood and affect   Laboratory Data:  Recent Labs    02/28/24 1654  WBC 7.4  HGB 13.1  HCT 40.1  PLT 167    Recent Labs    02/28/24 1654  NA 135  K 5.0  CL 102  GLUCOSE 148*  BUN 41*  CALCIUM  9.3  CREATININE 2.40*     Results for orders placed or performed during the hospital encounter of 03/02/24 (from the past 24 hours)  Glucose, capillary     Status: Abnormal   Collection Time: 03/02/24  8:59 AM  Result Value Ref Range   Glucose-Capillary 162 (H) 70 - 99  mg/dL   Comment 1 Notify RN    No results found for this or any previous visit (from the past 240 hours).  Renal Function: Recent Labs    02/28/24 1654  CREATININE 2.40*   Estimated Creatinine Clearance: 24.9 mL/min (A) (by C-G formula based on SCr of 2.4 mg/dL (H)).  Radiologic Imaging: No results found.  Assessment:  Eric [name] is a 83 y.o. year old M with hematuria, imaging suspicious for left ureteral mass, small suspicious area of bladder on cysto  Plan:  --to OR as planned for left dx ureteroscopy with indicated procedures, biopsy of bladder lesion. Discussed procedure and risks involved with patient (hematuria, infection, sepsis, need for additional procedures, damage to GU tract, procedure failure) Discussed possibility of ureteral stent included related discomfort.     Eric Foot, MD 03/02/2024, 11:19 AM  [facility] Pager: [phone]

## 2024-03-03 ENCOUNTER — Encounter (HOSPITAL_COMMUNITY): Payer: Self-pay | Admitting: Urology

## 2024-03-03 LAB — SURGICAL PATHOLOGY

## 2024-03-21 DIAGNOSIS — C662 Malignant neoplasm of left ureter: Secondary | ICD-10-CM | POA: Diagnosis not present

## 2024-03-21 DIAGNOSIS — C61 Malignant neoplasm of prostate: Secondary | ICD-10-CM | POA: Diagnosis not present

## 2024-03-22 ENCOUNTER — Other Ambulatory Visit: Payer: Self-pay | Admitting: Urology

## 2024-03-22 DIAGNOSIS — N1831 Chronic kidney disease, stage 3a: Secondary | ICD-10-CM | POA: Diagnosis not present

## 2024-03-22 DIAGNOSIS — Z794 Long term (current) use of insulin: Secondary | ICD-10-CM | POA: Diagnosis not present

## 2024-03-22 DIAGNOSIS — I129 Hypertensive chronic kidney disease with stage 1 through stage 4 chronic kidney disease, or unspecified chronic kidney disease: Secondary | ICD-10-CM | POA: Diagnosis not present

## 2024-03-22 DIAGNOSIS — Z23 Encounter for immunization: Secondary | ICD-10-CM | POA: Diagnosis not present

## 2024-03-22 DIAGNOSIS — E785 Hyperlipidemia, unspecified: Secondary | ICD-10-CM | POA: Diagnosis not present

## 2024-03-22 DIAGNOSIS — Z4681 Encounter for fitting and adjustment of insulin pump: Secondary | ICD-10-CM | POA: Diagnosis not present

## 2024-03-22 DIAGNOSIS — E1029 Type 1 diabetes mellitus with other diabetic kidney complication: Secondary | ICD-10-CM | POA: Diagnosis not present

## 2024-03-24 DIAGNOSIS — Z85820 Personal history of malignant melanoma of skin: Secondary | ICD-10-CM | POA: Diagnosis not present

## 2024-03-24 DIAGNOSIS — D225 Melanocytic nevi of trunk: Secondary | ICD-10-CM | POA: Diagnosis not present

## 2024-03-24 DIAGNOSIS — L814 Other melanin hyperpigmentation: Secondary | ICD-10-CM | POA: Diagnosis not present

## 2024-03-24 DIAGNOSIS — L820 Inflamed seborrheic keratosis: Secondary | ICD-10-CM | POA: Diagnosis not present

## 2024-03-24 DIAGNOSIS — L821 Other seborrheic keratosis: Secondary | ICD-10-CM | POA: Diagnosis not present

## 2024-03-24 DIAGNOSIS — Z85828 Personal history of other malignant neoplasm of skin: Secondary | ICD-10-CM | POA: Diagnosis not present

## 2024-04-08 ENCOUNTER — Other Ambulatory Visit: Payer: Self-pay | Admitting: Urology

## 2024-04-09 DIAGNOSIS — R519 Headache, unspecified: Secondary | ICD-10-CM | POA: Diagnosis not present

## 2024-04-09 DIAGNOSIS — R55 Syncope and collapse: Secondary | ICD-10-CM | POA: Diagnosis not present

## 2024-04-09 DIAGNOSIS — F419 Anxiety disorder, unspecified: Secondary | ICD-10-CM | POA: Diagnosis not present

## 2024-04-09 DIAGNOSIS — G44229 Chronic tension-type headache, not intractable: Secondary | ICD-10-CM | POA: Diagnosis not present

## 2024-04-27 NOTE — Progress Notes (Signed)
 Date of COVID positive in last 90 days:  PCP - Garnette Ore, MD Cardiologist -  Nathanel Pinal manages insulin  pump  Chest x-ray [facility] [place] EKG - 02/23/24 Epic Stress Test - N/A ECHO - 05/02/19 Epic Cardiac Cath - N/A Pacemaker/ICD device last checked:N/A Spinal Cord Stimulator:N/A  Bowel Prep - clear liquids day before, magnesium citrate  Sleep Study - yes CPAP - yes every night   Fasting Blood Sugar - 183-309 Checks Blood Sugar  5 times a day  Last dose of GLP1 agonist-  N/A GLP1 instructions:  Do not take after     Last dose of SGLT-2 inhibitors-  N/A SGLT-2 instructions:  Do not take after     Blood Thinner Instructions: N/A Last dose:   Time: Aspirin  Instructions: ASA 81, hold 5 days Last Dose:  Activity level: Can go up a flight of stairs and perform activities of daily living without stopping and without symptoms of chest pain or shortness of breath.   Anesthesia review: DM1 with insulin  pump, HTN, HLD, CKD  Patient denies shortness of breath, fever, cough and chest pain at PAT appointment  Patient verbalized understanding of instructions that were given to them at the PAT appointment. Patient was also instructed that they will need to review over the PAT instructions again at home before surgery.

## 2024-04-27 NOTE — Patient Instructions (Signed)
 SURGICAL WAITING ROOM VISITATION  Patients having surgery or a procedure may have no more than 2 support people in the waiting area - these visitors may rotate.    Children under the age of 74 must have an adult with them who is not the patient.  Visitors with respiratory illnesses are discouraged from visiting and should remain at home.  If the patient needs to stay at the hospital during part of their recovery, the visitor guidelines for inpatient rooms apply. Pre-op nurse will coordinate an appropriate time for 1 support person to accompany patient in pre-op.  This support person may not rotate.    Please refer to the [facility] website for the visitor guidelines for Inpatients (after your surgery is over and you are in a regular room).       Your procedure is scheduled on: 05/11/24   Report to [facility] Main Entrance    Report to admitting at 9:25 AM   Call this number if you have problems the morning of surgery [phone]   Follow a clear liquid diet the day before surgery.  Water  Non-Citrus Juices (without pulp, NO RED-Apple, White grape, White cranberry) Black Coffee (NO MILK/CREAM OR CREAMERS, sugar ok)  Clear Tea (NO MILK/CREAM OR CREAMERS, sugar ok) regular and decaf                             Plain Jell-O (NO RED)                                           Fruit ices (not with fruit pulp, NO RED)                                     Popsicles (NO RED)                                                               Sports drinks like Gatorade (NO RED)              Nothing to drink after midnight.          If you have questions, please contact your surgeon's office.   FOLLOW BOWEL PREP AND ANY ADDITIONAL PRE OP INSTRUCTIONS YOU RECEIVED FROM YOUR SURGEON'S OFFICE!!!     Oral Hygiene is also important to reduce your risk of infection.                                    Remember - BRUSH YOUR TEETH THE MORNING OF SURGERY WITH YOUR REGULAR  TOOTHPASTE  DENTURES WILL BE REMOVED PRIOR TO SURGERY PLEASE DO NOT APPLY Poly grip OR ADHESIVES!!!   Stop all vitamins and herbal supplements 7 days before surgery.   Take these medicines the morning of surgery with A SIP OF WATER : Amlodipine , Atorvastatin , Citalopram , Levothyroxine, Lorazepam, Omeprazole   DO NOT TAKE ANY ORAL DIABETIC MEDICATIONS DAY OF YOUR SURGERY  How to Manage Your Diabetes Before and After Surgery  Why  is it important to control my blood sugar before and after surgery? Improving blood sugar levels before and after surgery helps healing and can limit problems. A way of improving blood sugar control is eating a healthy diet by:  Eating less sugar and carbohydrates  Increasing activity/exercise  Talking with your doctor about reaching your blood sugar goals High blood sugars (greater than 180 mg/dL) can raise your risk of infections and slow your recovery, so you will need to focus on controlling your diabetes during the weeks before surgery. Make sure that the doctor who takes care of your diabetes knows about your planned surgery including the date and location.  How do I manage my blood sugar before surgery? Check your blood sugar at least 4 times a day, starting 2 days before surgery, to make sure that the level is not too high or low. Check your blood sugar the morning of your surgery when you wake up and every 2 hours until you get to the Short Stay unit. If your blood sugar is less than 70 mg/dL, you will need to treat for low blood sugar: Do not take insulin . Treat a low blood sugar (less than 70 mg/dL) with  cup of clear juice (cranberry or apple), 4 glucose tablets, OR glucose gel. Recheck blood sugar in 15 minutes after treatment (to make sure it is greater than 70 mg/dL). If your blood sugar is not greater than 70 mg/dL on recheck, call [phone] for further instructions. Report your blood sugar to the short stay nurse when you get to Short  Stay.  If you are admitted to the hospital after surgery: Your blood sugar will be checked by the staff and you will probably be given insulin  after surgery (instead of oral diabetes medicines) to make sure you have good blood sugar levels. The goal for blood sugar control after surgery is 80-180 mg/dL.   WHAT DO I DO ABOUT MY DIABETES MEDICATION?  For patients with insulin  pumps: Contact your diabetes doctor for specific instructions before surgery. Decrease basal rates by 20% at midnight the night before your surgery. Note that if your surgery is planned to be longer than 2 hours, your insulin  pump will be removed and intravenous (IV) insulin  will be started and managed by the nurses and the anesthesiologist. You will be able to restart your insulin  pump once you are awake and able to manage it.  Make sure to bring insulin  pump supplies to the hospital with you in case the  site needs to be changed.  Reviewed and Endorsed by [facility] Patient Education Committee, August 2015  Bring CPAP mask and tubing day of surgery.                              You may not have any metal on your body including jewelry, and body piercing             Do not wear lotions, powders, cologne, or deodorant              Men may shave face and neck.   Do not bring valuables to the hospital. [facility] IS NOT             RESPONSIBLE   FOR VALUABLES.   Contacts, glasses, dentures or bridgework may not be worn into surgery.   Bring small overnight bag day of surgery.   DO NOT BRING YOUR HOME MEDICATIONS TO THE HOSPITAL.  PHARMACY WILL DISPENSE MEDICATIONS LISTED ON YOUR MEDICATION LIST TO YOU DURING YOUR ADMISSION IN THE HOSPITAL!              Please read over the following fact sheets you were given: IF YOU HAVE QUESTIONS ABOUT YOUR PRE-OP INSTRUCTIONS PLEASE CALL [phone].   If you received a COVID test during your pre-op visit  it is requested that you wear a mask when out in public,  stay away from anyone that may not be feeling well and notify your surgeon if you develop symptoms. If you test positive for Covid or have been in contact with anyone that has tested positive in the last 10 days please notify you surgeon.     - Preparing for Surgery Before surgery, you can play an important role.  Because skin is not sterile, your skin needs to be as free of germs as possible.  You can reduce the number of germs on your skin by washing with CHG (chlorahexidine gluconate) soap before surgery.  CHG is an antiseptic cleaner which kills germs and bonds with the skin to continue killing germs even after washing. Please DO NOT use if you have an allergy to CHG or antibacterial soaps.  If your skin becomes reddened/irritated stop using the CHG and inform your nurse when you arrive at Short Stay. Do not shave (including legs and underarms) for at least 48 hours prior to the first CHG shower.  You may shave your face/neck.  Please follow these instructions carefully:  1.  Shower with CHG Soap the night before surgery ONLY (DO NOT USE THE SOAP THE MORNING OF SURGERY).  2.  If you choose to wash your hair, wash your hair first as usual with your normal  shampoo.  3.  After you shampoo, rinse your hair and body thoroughly to remove the shampoo.                             4.  Use CHG as you would any other liquid soap.  You can apply chg directly to the skin and wash.  Gently with a scrungie or clean washcloth.  5.  Apply the CHG Soap to your body ONLY FROM THE NECK DOWN.   Do   not use on face/ open                           Wound or open sores. Avoid contact with eyes, ears mouth and   genitals (private parts).                       Wash face,  Genitals (private parts) with your normal soap.             6.  Wash thoroughly, paying special attention to the area where your    surgery  will be performed.  7.  Thoroughly rinse your body with warm water  from the neck down.  8.  DO NOT  shower/wash with your normal soap after using and rinsing off the CHG Soap.                9.  Pat yourself dry with a clean towel.            10.  Wear clean pajamas.            11.  Place clean sheets on your bed  the night of your first shower and do not  sleep with pets. Day of Surgery : Do not apply any CHG, lotions/deodorants the morning of surgery.  Please wear clean clothes to the hospital/surgery center.  FAILURE TO FOLLOW THESE INSTRUCTIONS MAY RESULT IN THE CANCELLATION OF YOUR SURGERY  PATIENT SIGNATURE_________________________________  NURSE SIGNATURE__________________________________  ________________________________________________________________________

## 2024-04-28 ENCOUNTER — Encounter (HOSPITAL_COMMUNITY): Payer: Self-pay

## 2024-04-28 ENCOUNTER — Other Ambulatory Visit: Payer: Self-pay

## 2024-04-28 ENCOUNTER — Encounter (HOSPITAL_COMMUNITY)
Admission: RE | Admit: 2024-04-28 | Discharge: 2024-04-28 | Disposition: A | Source: Ambulatory Visit | Attending: Urology

## 2024-04-28 VITALS — BP 171/70 | HR 70 | Temp 98.3°F | Resp 16 | Ht 67.0 in | Wt 176.0 lb

## 2024-04-28 DIAGNOSIS — E109 Type 1 diabetes mellitus without complications: Secondary | ICD-10-CM

## 2024-04-28 DIAGNOSIS — Z01812 Encounter for preprocedural laboratory examination: Secondary | ICD-10-CM | POA: Diagnosis not present

## 2024-04-28 HISTORY — DX: Malignant melanoma of skin, unspecified: C43.9

## 2024-04-28 HISTORY — DX: Sleep apnea, unspecified: G47.30

## 2024-04-28 LAB — BASIC METABOLIC PANEL WITH GFR
Anion gap: 13 (ref 5–15)
BUN: 23 mg/dL (ref 8–23)
CO2: 22 mmol/L (ref 22–32)
Calcium: 9.4 mg/dL (ref 8.9–10.3)
Chloride: 101 mmol/L (ref 98–111)
Creatinine, Ser: 1.57 mg/dL — ABNORMAL HIGH (ref 0.61–1.24)
GFR, Estimated: 44 mL/min — ABNORMAL LOW (ref >60)
Glucose, Bld: 329 mg/dL — ABNORMAL HIGH (ref 70–99)
Potassium: 4.6 mmol/L (ref 3.5–5.1)
Sodium: 135 mmol/L (ref 135–145)

## 2024-04-28 LAB — GLUCOSE, CAPILLARY: Glucose-Capillary: 282 mg/dL — ABNORMAL HIGH (ref 70–99)

## 2024-04-28 LAB — CBC
HCT: 38.9 % — ABNORMAL LOW (ref 39.0–52.0)
Hemoglobin: 13.2 g/dL (ref 13.0–17.0)
MCH: 30.3 pg (ref 26.0–34.0)
MCHC: 33.9 g/dL (ref 30.0–36.0)
MCV: 89.4 fL (ref 80.0–100.0)
Platelets: 198 K/uL (ref 150–400)
RBC: 4.35 MIL/uL (ref 4.22–5.81)
RDW: 13.2 % (ref 11.5–15.5)
WBC: 7.8 K/uL (ref 4.0–10.5)
nRBC: 0 % (ref 0.0–0.2)

## 2024-04-28 LAB — TYPE AND SCREEN
ABO/RH(D): O POS
Antibody Screen: NEGATIVE

## 2024-04-29 LAB — HEMOGLOBIN A1C
Hgb A1c MFr Bld: 7.2 % — ABNORMAL HIGH (ref 4.8–5.6)
Mean Plasma Glucose: 160 mg/dL

## 2024-04-30 DIAGNOSIS — N189 Chronic kidney disease, unspecified: Secondary | ICD-10-CM | POA: Diagnosis not present

## 2024-04-30 DIAGNOSIS — N390 Urinary tract infection, site not specified: Secondary | ICD-10-CM | POA: Diagnosis not present

## 2024-04-30 DIAGNOSIS — Z794 Long term (current) use of insulin: Secondary | ICD-10-CM | POA: Diagnosis not present

## 2024-04-30 DIAGNOSIS — I129 Hypertensive chronic kidney disease with stage 1 through stage 4 chronic kidney disease, or unspecified chronic kidney disease: Secondary | ICD-10-CM | POA: Diagnosis not present

## 2024-04-30 DIAGNOSIS — Z79899 Other long term (current) drug therapy: Secondary | ICD-10-CM | POA: Diagnosis not present

## 2024-04-30 DIAGNOSIS — Z7982 Long term (current) use of aspirin: Secondary | ICD-10-CM | POA: Diagnosis not present

## 2024-04-30 DIAGNOSIS — E1122 Type 2 diabetes mellitus with diabetic chronic kidney disease: Secondary | ICD-10-CM | POA: Diagnosis not present

## 2024-04-30 DIAGNOSIS — Z960 Presence of urogenital implants: Secondary | ICD-10-CM | POA: Diagnosis not present

## 2024-04-30 DIAGNOSIS — R42 Dizziness and giddiness: Secondary | ICD-10-CM | POA: Diagnosis not present

## 2024-05-11 ENCOUNTER — Ambulatory Visit (HOSPITAL_COMMUNITY): Admitting: Anesthesiology

## 2024-05-11 ENCOUNTER — Encounter (HOSPITAL_COMMUNITY): Payer: Self-pay | Admitting: Medical

## 2024-05-11 ENCOUNTER — Encounter (HOSPITAL_COMMUNITY): Admission: RE | Disposition: A | Payer: Self-pay | Source: Home / Self Care | Attending: Internal Medicine

## 2024-05-11 ENCOUNTER — Inpatient Hospital Stay (HOSPITAL_COMMUNITY)
Admission: RE | Admit: 2024-05-11 | Discharge: 2024-05-15 | DRG: 653 | Disposition: A | Discharge status: Home / Self Care | Attending: Internal Medicine | Admitting: Internal Medicine

## 2024-05-11 ENCOUNTER — Ambulatory Visit (HOSPITAL_COMMUNITY)

## 2024-05-11 ENCOUNTER — Encounter (HOSPITAL_COMMUNITY): Payer: Self-pay | Admitting: Urology

## 2024-05-11 ENCOUNTER — Other Ambulatory Visit: Payer: Self-pay

## 2024-05-11 DIAGNOSIS — Z79890 Hormone replacement therapy (postmenopausal): Secondary | ICD-10-CM

## 2024-05-11 DIAGNOSIS — C662 Malignant neoplasm of left ureter: Secondary | ICD-10-CM

## 2024-05-11 DIAGNOSIS — E1065 Type 1 diabetes mellitus with hyperglycemia: Secondary | ICD-10-CM | POA: Diagnosis present

## 2024-05-11 DIAGNOSIS — E109 Type 1 diabetes mellitus without complications: Secondary | ICD-10-CM

## 2024-05-11 DIAGNOSIS — I129 Hypertensive chronic kidney disease with stage 1 through stage 4 chronic kidney disease, or unspecified chronic kidney disease: Secondary | ICD-10-CM | POA: Diagnosis present

## 2024-05-11 DIAGNOSIS — E785 Hyperlipidemia, unspecified: Secondary | ICD-10-CM | POA: Diagnosis not present

## 2024-05-11 DIAGNOSIS — Z9079 Acquired absence of other genital organ(s): Secondary | ICD-10-CM

## 2024-05-11 DIAGNOSIS — Z794 Long term (current) use of insulin: Secondary | ICD-10-CM

## 2024-05-11 DIAGNOSIS — E039 Hypothyroidism, unspecified: Secondary | ICD-10-CM | POA: Diagnosis present

## 2024-05-11 DIAGNOSIS — Z9641 Presence of insulin pump (external) (internal): Secondary | ICD-10-CM | POA: Diagnosis present

## 2024-05-11 DIAGNOSIS — N1832 Chronic kidney disease, stage 3b: Secondary | ICD-10-CM | POA: Diagnosis present

## 2024-05-11 DIAGNOSIS — F39 Unspecified mood [affective] disorder: Secondary | ICD-10-CM | POA: Diagnosis present

## 2024-05-11 DIAGNOSIS — E1022 Type 1 diabetes mellitus with diabetic chronic kidney disease: Secondary | ICD-10-CM | POA: Diagnosis present

## 2024-05-11 DIAGNOSIS — I10 Essential (primary) hypertension: Secondary | ICD-10-CM | POA: Diagnosis present

## 2024-05-11 DIAGNOSIS — F05 Delirium due to known physiological condition: Secondary | ICD-10-CM | POA: Diagnosis not present

## 2024-05-11 DIAGNOSIS — E872 Acidosis, unspecified: Secondary | ICD-10-CM | POA: Diagnosis present

## 2024-05-11 DIAGNOSIS — N2889 Other specified disorders of kidney and ureter: Principal | ICD-10-CM | POA: Diagnosis present

## 2024-05-11 DIAGNOSIS — G4733 Obstructive sleep apnea (adult) (pediatric): Secondary | ICD-10-CM | POA: Diagnosis present

## 2024-05-11 DIAGNOSIS — Z823 Family history of stroke: Secondary | ICD-10-CM

## 2024-05-11 DIAGNOSIS — E1010 Type 1 diabetes mellitus with ketoacidosis without coma: Principal | ICD-10-CM | POA: Diagnosis present

## 2024-05-11 DIAGNOSIS — Z8249 Family history of ischemic heart disease and other diseases of the circulatory system: Secondary | ICD-10-CM

## 2024-05-11 DIAGNOSIS — R531 Weakness: Secondary | ICD-10-CM

## 2024-05-11 DIAGNOSIS — F419 Anxiety disorder, unspecified: Secondary | ICD-10-CM | POA: Diagnosis present

## 2024-05-11 DIAGNOSIS — Z8546 Personal history of malignant neoplasm of prostate: Secondary | ICD-10-CM

## 2024-05-11 DIAGNOSIS — Z79899 Other long term (current) drug therapy: Secondary | ICD-10-CM

## 2024-05-11 DIAGNOSIS — Z85820 Personal history of malignant melanoma of skin: Secondary | ICD-10-CM

## 2024-05-11 HISTORY — PX: CYSTOSCOPY WITH INDOCYANINE GREEN IMAGING (ICG): SHX7549

## 2024-05-11 LAB — CBC WITH DIFFERENTIAL/PLATELET
Abs Immature Granulocytes: 0.07 K/uL (ref 0.00–0.07)
Basophils Absolute: 0 K/uL (ref 0.0–0.1)
Basophils Relative: 0 %
Eosinophils Absolute: 0 K/uL (ref 0.0–0.5)
Eosinophils Relative: 0 %
HCT: 37.1 % — ABNORMAL LOW (ref 39.0–52.0)
Hemoglobin: 12.2 g/dL — ABNORMAL LOW (ref 13.0–17.0)
Immature Granulocytes: 0 %
Lymphocytes Relative: 2 %
Lymphs Abs: 0.3 K/uL — ABNORMAL LOW (ref 0.7–4.0)
MCH: 30.3 pg (ref 26.0–34.0)
MCHC: 32.9 g/dL (ref 30.0–36.0)
MCV: 92.1 fL (ref 80.0–100.0)
Monocytes Absolute: 1 K/uL (ref 0.1–1.0)
Monocytes Relative: 6 %
Neutro Abs: 15.4 K/uL — ABNORMAL HIGH (ref 1.7–7.7)
Neutrophils Relative %: 92 %
Platelets: 195 K/uL (ref 150–400)
RBC: 4.03 MIL/uL — ABNORMAL LOW (ref 4.22–5.81)
RDW: 13.4 % (ref 11.5–15.5)
WBC: 16.7 K/uL — ABNORMAL HIGH (ref 4.0–10.5)
nRBC: 0 % (ref 0.0–0.2)

## 2024-05-11 LAB — COMPREHENSIVE METABOLIC PANEL WITH GFR
ALT: 19 U/L (ref 0–44)
AST: 25 U/L (ref 15–41)
Albumin: 4.1 g/dL (ref 3.5–5.0)
Alkaline Phosphatase: 91 U/L (ref 38–126)
Anion gap: 19 — ABNORMAL HIGH (ref 5–15)
BUN: 25 mg/dL — ABNORMAL HIGH (ref 8–23)
CO2: 20 mmol/L — ABNORMAL LOW (ref 22–32)
Calcium: 9.1 mg/dL (ref 8.9–10.3)
Chloride: 96 mmol/L — ABNORMAL LOW (ref 98–111)
Creatinine, Ser: 1.77 mg/dL — ABNORMAL HIGH (ref 0.61–1.24)
GFR, Estimated: 38 mL/min — ABNORMAL LOW (ref >60)
Glucose, Bld: 367 mg/dL — ABNORMAL HIGH (ref 70–99)
Potassium: 4.6 mmol/L (ref 3.5–5.1)
Sodium: 134 mmol/L — ABNORMAL LOW (ref 135–145)
Total Bilirubin: 0.5 mg/dL (ref 0.0–1.2)
Total Protein: 6.9 g/dL (ref 6.5–8.1)

## 2024-05-11 LAB — GLUCOSE, CAPILLARY
Glucose-Capillary: 147 mg/dL — ABNORMAL HIGH (ref 70–99)
Glucose-Capillary: 163 mg/dL — ABNORMAL HIGH (ref 70–99)
Glucose-Capillary: 169 mg/dL — ABNORMAL HIGH (ref 70–99)
Glucose-Capillary: 188 mg/dL — ABNORMAL HIGH (ref 70–99)
Glucose-Capillary: 286 mg/dL — ABNORMAL HIGH (ref 70–99)
Glucose-Capillary: 303 mg/dL — ABNORMAL HIGH (ref 70–99)
Glucose-Capillary: 306 mg/dL — ABNORMAL HIGH (ref 70–99)
Glucose-Capillary: 313 mg/dL — ABNORMAL HIGH (ref 70–99)

## 2024-05-11 LAB — HEMOGLOBIN AND HEMATOCRIT, BLOOD
HCT: 35.8 % — ABNORMAL LOW (ref 39.0–52.0)
Hemoglobin: 12.2 g/dL — ABNORMAL LOW (ref 13.0–17.0)

## 2024-05-11 SURGERY — REIMPLANTATION, URETER, ROBOT-ASSISTED, LAPAROSCOPIC
Anesthesia: General | Site: Ureter

## 2024-05-11 MED ORDER — DOCUSATE SODIUM 100 MG PO CAPS
100.0000 mg | ORAL_CAPSULE | Freq: Two times a day (BID) | ORAL | Status: DC
  Administered 2024-05-11 – 2024-05-15 (×7): 100 mg via ORAL
  Filled 2024-05-11 (×8): qty 1

## 2024-05-11 MED ORDER — HYDRALAZINE HCL 20 MG/ML IJ SOLN
10.0000 mg | Freq: Once | INTRAMUSCULAR | Status: AC
  Administered 2024-05-11: 19:00:00 10 mg via INTRAVENOUS

## 2024-05-11 MED ORDER — FENTANYL CITRATE (PF) 50 MCG/ML IJ SOSY
25.0000 ug | PREFILLED_SYRINGE | INTRAMUSCULAR | Status: DC | PRN
  Administered 2024-05-11 (×3): 50 ug via INTRAVENOUS

## 2024-05-11 MED ORDER — HYDROMORPHONE HCL 1 MG/ML IJ SOLN: 0.5000 mg | INTRAMUSCULAR | Status: DC | PRN

## 2024-05-11 MED ORDER — OXYCODONE HCL 5 MG PO TABS
5.0000 mg | ORAL_TABLET | ORAL | Status: DC | PRN
  Administered 2024-05-11 – 2024-05-13 (×3): 5 mg via ORAL
  Filled 2024-05-11 (×3): qty 1

## 2024-05-11 MED ORDER — SULFAMETHOXAZOLE-TRIMETHOPRIM 800-160 MG PO TABS: 1.0000 | ORAL_TABLET | Freq: Two times a day (BID) | ORAL | 0 refills | Status: DC

## 2024-05-11 MED ORDER — TRIPLE ANTIBIOTIC 3.5-400-5000 EX OINT: 1.0000 | TOPICAL_OINTMENT | Freq: Three times a day (TID) | CUTANEOUS | Status: DC | PRN

## 2024-05-11 MED ORDER — BOOST / RESOURCE BREEZE PO LIQD CUSTOM
1.0000 | Freq: Three times a day (TID) | ORAL | Status: DC
  Administered 2024-05-12 – 2024-05-13 (×4): 1 via ORAL

## 2024-05-11 MED ORDER — ACETAMINOPHEN 500 MG PO TABS
1000.0000 mg | ORAL_TABLET | Freq: Four times a day (QID) | ORAL | Status: AC
  Administered 2024-05-11 – 2024-05-12 (×4): 1000 mg via ORAL
  Filled 2024-05-11 (×4): qty 2

## 2024-05-11 MED ORDER — ONDANSETRON HCL 4 MG/2ML IJ SOLN: 4.0000 mg | Freq: Once | INTRAMUSCULAR | Status: DC | PRN

## 2024-05-11 MED ORDER — INSULIN ASPART 100 UNIT/ML IJ SOLN
0.0000 [IU] | INTRAMUSCULAR | Status: DC
  Administered 2024-05-11 (×2): 7 [IU] via SUBCUTANEOUS
  Filled 2024-05-11 (×2): qty 7

## 2024-05-11 MED ORDER — EPHEDRINE 5 MG/ML INJ
INTRAVENOUS | Status: AC
  Filled 2024-05-11: qty 5

## 2024-05-11 MED ORDER — ONDANSETRON HCL 4 MG/2ML IJ SOLN: 4.0000 mg | INTRAMUSCULAR | Status: DC | PRN

## 2024-05-11 MED ORDER — LACTATED RINGERS IV SOLN: INTRAVENOUS | Status: DC

## 2024-05-11 MED ORDER — OXYCODONE HCL 5 MG PO TABS
ORAL_TABLET | ORAL | Status: AC
  Filled 2024-05-11: qty 1

## 2024-05-11 MED ORDER — OXYCODONE HCL 5 MG PO TABS
5.0000 mg | ORAL_TABLET | Freq: Once | ORAL | Status: AC | PRN
  Administered 2024-05-11: 17:00:00 5 mg via ORAL

## 2024-05-11 MED ORDER — ROCURONIUM BROMIDE 10 MG/ML (PF) SYRINGE
PREFILLED_SYRINGE | INTRAVENOUS | Status: DC | PRN
  Administered 2024-05-11: 14:00:00 10 mg via INTRAVENOUS
  Administered 2024-05-11: 12:00:00 70 mg via INTRAVENOUS
  Administered 2024-05-11: 13:00:00 20 mg via INTRAVENOUS
  Administered 2024-05-11: 14:00:00 10 mg via INTRAVENOUS

## 2024-05-11 MED ORDER — CEFAZOLIN SODIUM-DEXTROSE 2-4 GM/100ML-% IV SOLN
2.0000 g | INTRAVENOUS | Status: AC
  Administered 2024-05-11: 12:00:00 2 g via INTRAVENOUS
  Filled 2024-05-11: qty 100

## 2024-05-11 MED ORDER — HYDROCODONE-ACETAMINOPHEN 5-325 MG PO TABS: 1.0000 | ORAL_TABLET | Freq: Four times a day (QID) | ORAL | 0 refills | Status: DC | PRN

## 2024-05-11 MED ORDER — OXYCODONE HCL 5 MG/5ML PO SOLN: 5.0000 mg | Freq: Once | ORAL | Status: AC | PRN

## 2024-05-11 MED ORDER — ATORVASTATIN CALCIUM 40 MG PO TABS
40.0000 mg | ORAL_TABLET | Freq: Every morning | ORAL | Status: DC
  Administered 2024-05-12 – 2024-05-15 (×4): 40 mg via ORAL
  Filled 2024-05-11 (×4): qty 1

## 2024-05-11 MED ORDER — SODIUM CHLORIDE 0.9 % IV SOLN: INTRAVENOUS | Status: DC

## 2024-05-11 MED ORDER — HYOSCYAMINE SULFATE 0.125 MG SL SUBL
0.1250 mg | SUBLINGUAL_TABLET | SUBLINGUAL | Status: DC | PRN
  Administered 2024-05-11 – 2024-05-12 (×2): 0.125 mg via SUBLINGUAL
  Filled 2024-05-11 (×2): qty 1

## 2024-05-11 MED ORDER — ACETAMINOPHEN 10 MG/ML IV SOLN
INTRAVENOUS | Status: AC
  Filled 2024-05-11: qty 100

## 2024-05-11 MED ORDER — BUPIVACAINE LIPOSOME 1.3 % IJ SUSP
INTRAMUSCULAR | Status: DC | PRN
  Administered 2024-05-11: 15:00:00 20 mL

## 2024-05-11 MED ORDER — PANTOPRAZOLE SODIUM 40 MG PO TBEC
80.0000 mg | DELAYED_RELEASE_TABLET | Freq: Every day | ORAL | Status: DC
  Administered 2024-05-12 – 2024-05-15 (×4): 80 mg via ORAL
  Filled 2024-05-11 (×4): qty 2

## 2024-05-11 MED ORDER — MIRTAZAPINE 15 MG PO TABS
15.0000 mg | ORAL_TABLET | Freq: Every day | ORAL | Status: DC
  Administered 2024-05-11 – 2024-05-13 (×3): 15 mg via ORAL
  Filled 2024-05-11 (×4): qty 1

## 2024-05-11 MED ORDER — MAGNESIUM CITRATE PO SOLN: 1.0000 | Freq: Once | ORAL | Status: DC

## 2024-05-11 MED ORDER — DEXAMETHASONE SOD PHOSPHATE PF 10 MG/ML IJ SOLN
INTRAMUSCULAR | Status: DC | PRN
  Administered 2024-05-11: 13:00:00 4 mg via INTRAVENOUS

## 2024-05-11 MED ORDER — HYDRALAZINE HCL 20 MG/ML IJ SOLN
10.0000 mg | Freq: Once | INTRAMUSCULAR | Status: AC
  Administered 2024-05-11: 16:00:00 10 mg via INTRAVENOUS

## 2024-05-11 MED ORDER — HYDRALAZINE HCL 20 MG/ML IJ SOLN: 10.0000 mg | Freq: Four times a day (QID) | INTRAMUSCULAR | Status: DC | PRN

## 2024-05-11 MED ORDER — CHLORHEXIDINE GLUCONATE 0.12 % MT SOLN
15.0000 mL | Freq: Once | OROMUCOSAL | Status: AC
  Administered 2024-05-11: 10:00:00 15 mL via OROMUCOSAL

## 2024-05-11 MED ORDER — SODIUM CHLORIDE 0.9 % IV BOLUS
500.0000 mL | Freq: Once | INTRAVENOUS | Status: AC
  Administered 2024-05-11: 500 mL via INTRAVENOUS

## 2024-05-11 MED ORDER — STERILE WATER FOR IRRIGATION IR SOLN
Status: DC | PRN
  Administered 2024-05-11: 13:00:00 1000 mL

## 2024-05-11 MED ORDER — DOCUSATE SODIUM 100 MG PO CAPS: 100.0000 mg | ORAL_CAPSULE | Freq: Two times a day (BID) | ORAL | Status: AC

## 2024-05-11 MED ORDER — MIDAZOLAM HCL 2 MG/2ML IJ SOLN
INTRAMUSCULAR | Status: AC
  Filled 2024-05-11: qty 2

## 2024-05-11 MED ORDER — ONDANSETRON HCL 4 MG/2ML IJ SOLN
INTRAMUSCULAR | Status: DC | PRN
  Administered 2024-05-11: 15:00:00 4 mg via INTRAVENOUS

## 2024-05-11 MED ORDER — LIDOCAINE HCL (PF) 2 % IJ SOLN
INTRAMUSCULAR | Status: AC
  Filled 2024-05-11: qty 5

## 2024-05-11 MED ORDER — FENTANYL CITRATE (PF) 50 MCG/ML IJ SOSY
PREFILLED_SYRINGE | INTRAMUSCULAR | Status: AC
  Filled 2024-05-11: qty 1

## 2024-05-11 MED ORDER — INSULIN ASPART 100 UNIT/ML IJ SOLN: 0.0000 [IU] | Freq: Three times a day (TID) | INTRAMUSCULAR | Status: DC

## 2024-05-11 MED ORDER — ALBUMIN HUMAN 5 % IV SOLN: INTRAVENOUS | Status: DC | PRN

## 2024-05-11 MED ORDER — CITALOPRAM HYDROBROMIDE 20 MG PO TABS
20.0000 mg | ORAL_TABLET | Freq: Every morning | ORAL | Status: DC
  Administered 2024-05-12 – 2024-05-15 (×4): 20 mg via ORAL
  Filled 2024-05-11 (×4): qty 1

## 2024-05-11 MED ORDER — INSULIN ASPART 100 UNIT/ML IJ SOLN
0.0000 [IU] | INTRAMUSCULAR | Status: DC | PRN
  Administered 2024-05-11: 11:00:00 2 [IU] via SUBCUTANEOUS
  Filled 2024-05-11: qty 2

## 2024-05-11 MED ORDER — FENTANYL CITRATE (PF) 100 MCG/2ML IJ SOLN
INTRAMUSCULAR | Status: AC
  Filled 2024-05-11: qty 2

## 2024-05-11 MED ORDER — SODIUM CHLORIDE (PF) 0.9 % IJ SOLN
INTRAMUSCULAR | Status: AC
  Filled 2024-05-11: qty 20

## 2024-05-11 MED ORDER — HYDRALAZINE HCL 20 MG/ML IJ SOLN
INTRAMUSCULAR | Status: AC
  Filled 2024-05-11: qty 1

## 2024-05-11 MED ORDER — DIPHENHYDRAMINE HCL 12.5 MG/5ML PO ELIX: 12.5000 mg | ORAL_SOLUTION | Freq: Four times a day (QID) | ORAL | Status: DC | PRN

## 2024-05-11 MED ORDER — ACETAMINOPHEN 10 MG/ML IV SOLN
1000.0000 mg | Freq: Once | INTRAVENOUS | Status: DC | PRN
  Administered 2024-05-11: 17:00:00 1000 mg via INTRAVENOUS

## 2024-05-11 MED ORDER — SODIUM CHLORIDE 0.9 % IR SOLN
Status: DC | PRN
  Administered 2024-05-11: 13:00:00 1000 mL

## 2024-05-11 MED ORDER — DIPHENHYDRAMINE HCL 50 MG/ML IJ SOLN: 12.5000 mg | Freq: Four times a day (QID) | INTRAMUSCULAR | Status: DC | PRN

## 2024-05-11 MED ORDER — SUGAMMADEX SODIUM 200 MG/2ML IV SOLN
INTRAVENOUS | Status: AC
  Filled 2024-05-11: qty 2

## 2024-05-11 MED ORDER — ONDANSETRON HCL 4 MG/2ML IJ SOLN
INTRAMUSCULAR | Status: AC
  Filled 2024-05-11: qty 2

## 2024-05-11 MED ORDER — SODIUM CHLORIDE 0.9% FLUSH
INTRAVENOUS | Status: DC | PRN
  Administered 2024-05-11: 15:00:00 20 mL

## 2024-05-11 MED ORDER — ROCURONIUM BROMIDE 10 MG/ML (PF) SYRINGE
PREFILLED_SYRINGE | INTRAVENOUS | Status: AC
  Filled 2024-05-11: qty 10

## 2024-05-11 MED ORDER — LEVOTHYROXINE SODIUM 75 MCG PO TABS
75.0000 ug | ORAL_TABLET | Freq: Every day | ORAL | Status: DC
  Administered 2024-05-12 – 2024-05-15 (×4): 75 ug via ORAL
  Filled 2024-05-11 (×3): qty 1
  Filled 2024-05-11: qty 3

## 2024-05-11 MED ORDER — AMLODIPINE BESYLATE 5 MG PO TABS
5.0000 mg | ORAL_TABLET | Freq: Every morning | ORAL | Status: DC
  Administered 2024-05-12 – 2024-05-15 (×4): 5 mg via ORAL
  Filled 2024-05-11 (×4): qty 1

## 2024-05-11 MED ORDER — BUPIVACAINE LIPOSOME 1.3 % IJ SUSP
INTRAMUSCULAR | Status: AC
  Filled 2024-05-11: qty 20

## 2024-05-11 MED ORDER — ORAL CARE MOUTH RINSE: 15.0000 mL | Freq: Once | OROMUCOSAL | Status: AC

## 2024-05-11 MED ORDER — EPHEDRINE SULFATE (PRESSORS) 25 MG/5ML IV SOSY
PREFILLED_SYRINGE | INTRAVENOUS | Status: DC | PRN
  Administered 2024-05-11: 13:00:00 10 mg via INTRAVENOUS

## 2024-05-11 MED ORDER — PROPOFOL 10 MG/ML IV BOLUS
INTRAVENOUS | Status: AC
  Filled 2024-05-11: qty 20

## 2024-05-11 MED ADMIN — Fentanyl Citrate Preservative Free (PF) Inj 100 MCG/2ML: 50 ug | INTRAVENOUS | @ 12:00:00 | NDC 72572017025

## 2024-05-11 MED ADMIN — Lidocaine HCl(Cardiac) IV PF Soln Pref Syr 100 MG/5ML (2%): 100 mg | INTRAVENOUS | @ 12:00:00 | NDC 00409132305

## 2024-05-11 MED ADMIN — Sugammadex Sodium IV 200 MG/2ML (Base Equivalent): 200 mg | INTRAVENOUS | @ 15:00:00 | NDC 00006542302

## 2024-05-11 MED ADMIN — Fentanyl Citrate Preservative Free (PF) Inj 100 MCG/2ML: 50 ug | INTRAVENOUS | @ 13:00:00 | NDC 72572017001

## 2024-05-11 MED ADMIN — PROPOFOL 200 MG/20ML IV EMUL: 100 mg | INTRAVENOUS | @ 12:00:00 | NDC 00069020910

## 2024-05-11 MED FILL — Fentanyl Citrate PF Soln Prefilled Syringe 50 MCG/ML: INTRAMUSCULAR | Qty: 2 | Status: AC

## 2024-05-11 MED FILL — Fentanyl Citrate Preservative Free (PF) Inj 100 MCG/2ML: INTRAMUSCULAR | Qty: 2 | Status: AC

## 2024-05-11 SURGICAL SUPPLY — 63 items
APPLICATOR SURGIFLO ENDO (HEMOSTASIS) IMPLANT
BAG COUNTER SPONGE SURGICOUNT (BAG) IMPLANT
CATH FOLEY 3WAY 30CC 22FR (CATHETERS) IMPLANT
CATH FOLEY 3WAY 5CC 18FR (CATHETERS) IMPLANT
CATH URETL OPEN END 6FR 70 (CATHETERS) IMPLANT
CHLORAPREP W/TINT 26 (MISCELLANEOUS) ×2 IMPLANT
CLIP LIGATING HEMO LOK XL GOLD (MISCELLANEOUS) ×2 IMPLANT
CLIP LIGATING HEMO O LOK GREEN (MISCELLANEOUS) ×2 IMPLANT
COVER MAYO STAND XLG (MISCELLANEOUS) IMPLANT
COVER SURGICAL LIGHT HANDLE (MISCELLANEOUS) ×2 IMPLANT
COVER TIP SHEARS 8 DVNC (MISCELLANEOUS) ×2 IMPLANT
DERMABOND ADVANCED .7 DNX12 (GAUZE/BANDAGES/DRESSINGS) ×2 IMPLANT
DRAPE ARM DVNC X/XI (DISPOSABLE) ×8 IMPLANT
DRAPE COLUMN DVNC XI (DISPOSABLE) ×2 IMPLANT
DRAPE SURG IRRIG POUCH 19X23 (DRAPES) ×2 IMPLANT
DRIVER NDL LRG 8 DVNC XI (INSTRUMENTS) ×4 IMPLANT
DRIVER NDLE LRG 8 DVNC XI (INSTRUMENTS) ×4 IMPLANT
ELECT PENCIL ROCKER SW 15FT (MISCELLANEOUS) ×2 IMPLANT
ELECT REM PT RETURN 15FT ADLT (MISCELLANEOUS) ×2 IMPLANT
FORCEPS BPLR FENES DVNC XI (FORCEP) IMPLANT
FORCEPS BPLR LNG DVNC XI (INSTRUMENTS) ×2 IMPLANT
FORCEPS PROGRASP DVNC XI (FORCEP) ×2 IMPLANT
GAUZE SPONGE 4X4 12PLY STRL (GAUZE/BANDAGES/DRESSINGS) ×2 IMPLANT
GLOVE BIO SURGEON STRL SZ 6.5 (GLOVE) ×2 IMPLANT
GLOVE SURG LX STRL 7.5 STRW (GLOVE) ×4 IMPLANT
GOWN STRL REUS W/ TWL XL LVL3 (GOWN DISPOSABLE) ×4 IMPLANT
GOWN STRL SURGICAL XL XLNG (GOWN DISPOSABLE) ×2 IMPLANT
GUIDEWIRE ZIPWRE .038 STRAIGHT (WIRE) IMPLANT
HOLDER FOLEY CATH W/STRAP (MISCELLANEOUS) ×2 IMPLANT
IRRIGATION SUCT STRKRFLW 2 WTP (MISCELLANEOUS) ×2 IMPLANT
KIT TURNOVER KIT A (KITS) ×2 IMPLANT
LOOP VESSEL MAXI BLUE (MISCELLANEOUS) ×2 IMPLANT
NDL INSUFFLATION 14GA 120MM (NEEDLE) ×2 IMPLANT
NEEDLE INSUFFLATION 14GA 120MM (NEEDLE) ×2 IMPLANT
NS IRRIG 1000ML POUR BTL (IV SOLUTION) ×2 IMPLANT
PACK ROBOT UROLOGY CUSTOM (CUSTOM PROCEDURE TRAY) ×2 IMPLANT
PAD POSITIONING PINK XL (MISCELLANEOUS) IMPLANT
PORT ACCESS TROCAR AIRSEAL 12 (TROCAR) ×2 IMPLANT
SCISSORS LAP 5X45 EPIX DISP (ENDOMECHANICALS) IMPLANT
SCISSORS MNPLR CVD DVNC XI (INSTRUMENTS) ×2 IMPLANT
SEAL UNIV 5-12 XI (MISCELLANEOUS) ×8 IMPLANT
SET TRI-LUMEN FLTR TB AIRSEAL (TUBING) ×2 IMPLANT
SOLUTION ELECTROSURG ANTI STCK (MISCELLANEOUS) ×2 IMPLANT
SPIKE FLUID TRANSFER (MISCELLANEOUS) ×2 IMPLANT
SPONGE T-LAP 18X18 ~~LOC~~+RFID (SPONGE) IMPLANT
SPONGE T-LAP 4X18 ~~LOC~~+RFID (SPONGE) IMPLANT
STENT URET 6FRX26 CONTOUR (STENTS) IMPLANT
SURGIFLO W/THROMBIN 8M KIT (HEMOSTASIS) IMPLANT
SUT ETHILON 3 0 FSL (SUTURE) ×2 IMPLANT
SUT MNCRL AB 4-0 PS2 18 (SUTURE) ×4 IMPLANT
SUT NOVA NAB DX-16 0-1 5-0 T12 (SUTURE) IMPLANT
SUT PDS AB 0 CT1 36 (SUTURE) IMPLANT
SUT PDS AB 2-0 CT2 27 (SUTURE) IMPLANT
SUT VIC AB 0 CT1 27XBRD ANTBC (SUTURE) ×2 IMPLANT
SUT VIC AB 2-0 CT2 27 (SUTURE) IMPLANT
SUT VIC AB 2-0 SH 27X BRD (SUTURE) IMPLANT
SUT VIC AB 3-0 SH 27X BRD (SUTURE) IMPLANT
SUT VIC AB 4-0 RB1 27XBRD (SUTURE) IMPLANT
SUT VIC AB 4-0 SH 18 (SUTURE) IMPLANT
SUT VICRYL 0 27 CT2 27 ABS (SUTURE) IMPLANT
SUT VICRYL 0 UR6 27IN ABS (SUTURE) IMPLANT
SUTURE VLOC BRB 180 ABS3/0GR12 (SUTURE) IMPLANT
WATER STERILE IRR 1000ML POUR (IV SOLUTION) ×2 IMPLANT

## 2024-05-11 NOTE — Discharge Instructions (Signed)
 1- Drain Sites - You may have some mild persistent drainage from old drain site for several days, this is normal. This can be covered with cotton gauze for convenience.  2 - Stiches - Your stitches are all dissolvable. You may notice a "loose thread" at your incisions, these are normal and require no intervention. You may cut them flush to the skin with fingernail clippers if needed for comfort.  3 - Diet - No restrictions  4 - Activity - No heavy lifting / straining (any activities that require valsalva or "bearing down") x 4 weeks. Otherwise, no restrictions.  5 - Bathing - You may shower immediately. Do not take a bath or get into swimming pool where incision sites are submersed in water x 4 weeks.   6 - Catheter - Will remain in place until removed at your next appointment. It may be cleaned with soap and water in the shower. It may be disconnected from the drain bag while in the shower to avoid tripping over the tube. You may apply Neosporin or Vaseline ointment as needed to the tip of the penis where the catheter inserts to reduce friction and irritation in this spot.   7 - When to Call the Doctor - Call MD for any fever >102, any acute wound problems, or any severe nausea / vomiting. You can call the [facility] [phone]) 24 hours a day 365 days a year. It will roll-over to the answering service and on-call physician after hours.

## 2024-05-11 NOTE — Transfer of Care (Signed)
 Immediate Anesthesia Transfer of Care Note  Patient: Eric [name]  Procedure(s) Performed: REIMPLANTATION, URETER, ROBOT-ASSISTED, LAPAROSCOPIC (Abdomen) CYSTOSCOPY WITH INDOCYANINE GREEN  IMAGING (ICG) (Left: Ureter)  Patient Location: PACU  Anesthesia Type:General  Level of Consciousness: drowsy  Airway & Oxygen  Therapy: Patient Spontanous Breathing and Patient connected to face mask oxygen   Post-op Assessment: Report given to RN, Post -op Vital signs reviewed and stable, and Patient moving all extremities X 4  Post vital signs: Reviewed and stable  Last Vitals:  Vitals Value Taken Time  BP 179/61   Temp    Pulse 64 05/11/24 15:22  Resp 15 05/11/24 15:22  SpO2 99 % 05/11/24 15:22  Vitals shown include unfiled device data.  Last Pain:  Vitals:   05/11/24 1010  TempSrc:   PainSc: 0-No pain         Complications: No notable events documented.

## 2024-05-11 NOTE — H&P (Signed)
 Eric [name] is an 83 y.o. male.    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 Cr 2.2 at baseline. Left JJ stent in place. Contralateral kidney unremarkable.  2 - Prostate Cancer - s/p robotic prostatectomy previously. PSA <.01 2018  3 - Stage 4 Renal Insuficinecy - long h/o DM. Cr mid 2's at baseline.  PMH sig for IDDM, ventral hernia repair (after prostatectomy), no ischemic CV disease / blood thinners. His PCP is S. South MD. LIves independantly wtih wife Eric [name], drives, independant in all ADL's. Son Eric [name] in [place] very infolved at [phone]  Today Eric [name] is seen to proceed with LEFT robotic distal ureterectomy / reimplant for large volume distal ureteral cancer. Hgb 13, Cr 1.57, A1c 7s most recently.   Past Medical History:  Diagnosis Date   Anxiety    Arthritis    Diabetes mellitus    insulin  pump   Hearing problem    History of blurred vision    Hyperlipidemia    Hypertension    Impotence of organic origin    Malignant neoplasm prostate (HCC)    Nocturia    Right shoulder pain    due to prior surgery   Shoulder injury    continues to have occ pain   Skin melanoma (HCC)    to chest, ahs been removed   Sleep apnea    Snoring disorder    loud  per wife.    Stress incontinence, male    Type I diabetes mellitus (HCC)    Ventral hernia     Past Surgical History:  Procedure Laterality Date   CYSTOSCOPY  01/06/2012   Procedure: CYSTOSCOPY FLEXIBLE;  Surgeon: Eric LILLETTE Gal, MD;  Location: [facility] OR;  Service: Urology;  Laterality: N/A;   CYSTOSCOPY WITH URETHRAL DILATATION  01/06/2012   Procedure: CYSTOSCOPY WITH URETHRAL DILATATION;  Surgeon: Eric LILLETTE Gal, MD;   Location: [facility] OR;  Service: Urology;  Laterality: N/A;  insertion of foley catheter   EYE SURGERY  04/28/2011   macular pucker   HERNIA REPAIR     PROSTATE SURGERY  02/23/2010   ROTATOR CUFF REPAIR Right    TRANSURETHRAL RESECTION OF BLADDER TUMOR N/A 03/02/2024   Procedure: TURBT (TRANSURETHRAL RESECTION OF BLADDER TUMOR);  Surgeon: Eric Arlyss CROME, MD;  Location: [facility] ORS;  Service: Urology;  Laterality: N/A;   URETEROSCOPY Left 03/02/2024   Procedure: DIAGNOSTIC URETEROSCOPY/STENT PLACEMENT;  Surgeon: Eric Arlyss CROME, MD;  Location: [facility] ORS;  Service: Urology;  Laterality: Left;   VENTRAL HERNIA REPAIR  01/06/2012   Procedure: LAPAROSCOPIC VENTRAL HERNIA;  Surgeon: Eric GORMAN Curvin DOUGLAS, MD;  Location: [facility] OR;  Service: General;  Laterality: N/A;    Family History  Problem Relation Age of Onset   Stroke Mother    Heart disease Father    Cancer Unknown    Heart disease Unknown    Diabetes Unknown    Social History:  reports that he has never smoked. He has never used smokeless tobacco. He reports that he does not drink alcohol  and does not use drugs.  Allergies: Allergies[1]  No medications prior to admission.    No results found for this or any previous visit (from the past  48 hours). No results found.  Review of Systems  Constitutional:  Negative for chills and fever.  All other systems reviewed and are negative.   There were no vitals taken for this visit. Physical Exam Vitals reviewed.  HENT:     Head: Normocephalic.  Eyes:     Pupils: Pupils are equal, round, and reactive to light.  Cardiovascular:     Rate and Rhythm: Normal rate.  Pulmonary:     Effort: Pulmonary effort is normal.  Abdominal:     General: Abdomen is flat.     Comments: Prior scars w/o hernias.  Genitourinary:    Comments: No CVAT, in pull up.  Musculoskeletal:        General: Normal range of motion.     Cervical back: Normal range of motion.  Skin:    General: Skin is warm.  Neurological:      General: No focal deficit present.     Mental Status: He is alert.  Psychiatric:        Mood and Affect: Mood normal.      Assessment/Plan  Proceed as planned with cysto / stent exchange / LEFT robotic distal ureterectomy / reimplant. Risks, benefits, alternatives, expected peri-op course discussed previously and reiterated today. He understands that his advanced age + metabolic comorbidity + extesive prior abdominal surgery increases risk of ALL peri-op complications including infections / bowel injury / mortality.   Eric [name]., MD 05/11/2024, 6:44 AM       [1] No Known Allergies

## 2024-05-11 NOTE — Anesthesia Procedure Notes (Signed)
 Procedure Name: Intubation Date/Time: 05/11/2024 12:24 PM  Performed by: Brandy Almarie BROCKS, CRNAPre-anesthesia Checklist: Patient identified, Emergency Drugs available, Suction available and Patient being monitored Patient Re-evaluated:Patient Re-evaluated prior to induction Oxygen  Delivery Method: Circle system utilized Preoxygenation: Pre-oxygenation with 100% oxygen  Induction Type: IV induction Ventilation: Mask ventilation without difficulty and Oral airway inserted - appropriate to patient size Laryngoscope Size: Mac and 4 Grade View: Grade II Tube type: Oral Tube size: 7.5 mm Number of attempts: 1 Airway Equipment and Method: Bougie stylet Placement Confirmation: ETT inserted through vocal cords under direct vision, positive ETCO2 and breath sounds checked- equal and bilateral Secured at: 23 cm Tube secured with: Tape Dental Injury: Teeth and Oropharynx as per pre-operative assessment  Comments: Grade 2b view. Passed once into esophagus. DL again with bougie, passed easily.

## 2024-05-11 NOTE — Anesthesia Preprocedure Evaluation (Addendum)
 Anesthesia Evaluation  Patient identified by MRN, date of birth, ID band Patient awake    Reviewed: Allergy & Precautions, NPO status , Patient's Chart, lab work & pertinent test results, reviewed documented beta blocker date and time   History of Anesthesia Complications Negative for: history of anesthetic complications  Airway Mallampati: II  TM Distance: >3 FB     Dental  (+) Partial Upper   Pulmonary sleep apnea and Continuous Positive Airway Pressure Ventilation , neg COPD   breath sounds clear to auscultation       Cardiovascular Exercise Tolerance: Poor hypertension, (-) CAD, (-) Past MI and (-) Cardiac Stents  Rhythm:Regular Rate:Normal  IMPRESSIONS     1. Left ventricular ejection fraction, by visual estimation, is 60 to  65%. The left ventricle has normal function. There is mildly increased  left ventricular hypertrophy.   2. Left ventricular diastolic parameters are consistent with Grade I  diastolic dysfunction (impaired relaxation).   3. The left ventricle has no regional wall motion abnormalities.   4. Global right ventricle has normal systolic function.The right  ventricular size is normal. No increase in right ventricular wall  thickness.   5. Left atrial size was normal.   6. Right atrial size was normal.   7. The mitral valve is normal in structure. No evidence of mitral valve  regurgitation. No evidence of mitral stenosis.   8. The tricuspid valve is normal in structure. Tricuspid valve  regurgitation is not demonstrated.   9. The aortic valve is tricuspid. Aortic valve regurgitation is not  visualized. Mild aortic valve sclerosis without stenosis  10. TR signal is inadequate for assessing pulmonary artery systolic  pressure.     Neuro/Psych neg Seizures  Anxiety        GI/Hepatic ,neg GERD  ,,(+) neg Cirrhosis        Endo/Other  diabetes, Type 1    Renal/GU Renal InsufficiencyRenal diseaseHigh  Grade Left Distal Ureteral / Bladder Cancer - high grade not endoscoppiocally managable left distal ureteral cancer (below iliacs, just above UO for few cm) on MRI and OR retrograde / BX 02/2024 on eval gross hematuria. Cr 2.2 at baseline. Left JJ stent in place. Contralateral kidney unremarkable.     Musculoskeletal  (+) Arthritis , Osteoarthritis,    Abdominal   Peds  Hematology   Anesthesia Other Findings   Reproductive/Obstetrics                              Anesthesia Physical Anesthesia Plan  ASA: 2  Anesthesia Plan: General   Post-op Pain Management:    Induction: Intravenous  PONV Risk Score and Plan: 2 and Ondansetron  and Dexamethasone   Airway Management Planned: Oral ETT  Additional Equipment:   Intra-op Plan:   Post-operative Plan: Extubation in OR  Informed Consent: I have reviewed the patients History and Physical, chart, labs and discussed the procedure including the risks, benefits and alternatives for the proposed anesthesia with the patient or authorized representative who has indicated his/her understanding and acceptance.     Dental advisory given  Plan Discussed with: CRNA  Anesthesia Plan Comments:          Anesthesia Quick Evaluation

## 2024-05-11 NOTE — Op Note (Unsigned)
 NAME: Eric [name], Eric [name] MEDICAL RECORD NO: [id number] ACCOUNT NO: [id number] DATE OF BIRTH: 1940/11/30 FACILITY: [facility] LOCATION: [id number] PHYSICIAN: Ricardo Likens, MD  Operative Report   DATE OF PROCEDURE: 05/11/2024  SURGEON: Ricardo Likens, MD  PREOPERATIVE DIAGNOSIS:  Left distal ureteral cancer, high grade.  PROCEDURES PERFORMED: 1.  Cystoscopy with left retrograde pyelogram interpretation and exchange of left ureteral stent. 2.  Left robotic distal ureterectomy with psoas hitch ureteral reimplantation. 3.  Pelvic lymph node dissection.  ESTIMATED BLOOD LOSS:  200 mL.  COMPLICATIONS:  None.  SPECIMENS: 1.  Left distal ureter margin, frozen section negative. 2.  Left distal ureter with bladder cuff. 3.  Left common iliac lymph nodes. 4.  Left external lymph nodes. 5.  Left obturator lymph nodes.  FINDINGS: 1.  Single left ureter, large filling defect in the distal ureter consistent with a known tumor with some ongoing hematuria. 2.  Successful exchange of left ureteral stent.  ASSISTANT:  Alan Mcmurray, PA  DRAINS: 1.  Jackson-Pratt drain to bulb section. 2.  Foley catheter to straight drain.  INDICATIONS:  The patient is an 83 year old man with a history of prostate cancer status post prostatectomy years ago who was found on workup for gross hematuria to have a new focus of left distal ureteral cancer as well as several small foci of bladder  tumor.  He underwent transurethral resection of his bladder tumor successfully; however, his left distal ureteral tumor was much too large for endoscopic management and was a continued source of his ongoing hematuria.  Biopsies showed a high grade  disease but no locally advanced disease on imaging.  Options were discussed for management including curative and non-curative pathways.  He wished to proceed with curative left distal ureterectomy, lymph node dissection, and reimplantation.  He does  also have a history of mesh based  umbilical hernia repair, significant diabetes, and some cognitive decline that is mild.  He does have excellent family support.  He wants to proceed and informed consent was obtained and placed in the medical record.  DESCRIPTION OF PROCEDURE:  The patient being Eric [name] identified and procedure being cystoscopy, left ureteral stent exchange, left robotic distal ureterectomy, ureteral reimplantation was confirmed.  Procedure timeout was performed.  Intravenous  antibiotics were administered.  General endotracheal anesthesia was induced.  The patient was placed into a low lithotomy position.  Sterile field was created, prepping and draping the patient's penis, perineum, and proximal thighs using iodine, and his  infra-xiphoid abdomen utilizing chlorhexidine  gluconate after clipper shaving, and after he was further fashioned to the operative table using 3-inch tape with foam padding across the supraxiphoid chest, arms tucked at his side with gel rolls.  A test of  steep Trendelenburg positioning was performed.  He was found to be suitably positioned and an LAVH type drape was placed.  Attention was directed at the left ureteral stent exchange.  Cystourethroscopy was performed using a 21-French rigid cystoscope with offset lens.  Inspection of the anterior posterior urethra revealed surgical collapse of the prostate.  Inspection of the  bladder revealed some small volume of formed clot and some ongoing hematuria associated with a left ureteral stent.  There were no foci of papillary tumor seen.  Distal end of the left stent was grasped, brought to the level of the urethral meatus, and  exchanged for an open-ended working wire via a ZIPwire, and a left retrograde pyelogram was obtained.   The left retrograde pyelogram demonstrated a single left ureter with  a somewhat trifid system left kidney.  There was a large filling defect in the distal ureter consistent with a known tumor.  This appeared to be  below the iliac crossing based on  pulsations, visualized with fluoroscopy as anticipated.  The ZIPwire was once again advanced and a new 6 x 26 Contour type stent was securely placed using fluoroscopic guidance.  Good proximal and distal planes were noted, and a 22-French three-way Foley  catheter was placed for urethra drainage.  15 mL was instilled in the balloon.  Irrigation ports were plugged at this point.  Next, a high flow, low pressure pneumoperitoneum was obtained using a Veress technique in the supraumbilical midline after having passed the aspiration and drop test, and an 8-mm robotic camera port was placed in this position, approximately 4  fingerbreadths lateral to the umbilicus.  This position was chosen as it appeared to be away from his area of prior umbilical hernia repair and any obvious direct bowel apposition to the abdominal wall based on prior imaging.  Laparoscopic examination of  the peritoneal cavity did reveal significant adhesions, multifocal, mostly omental near his area of prior umbilical hernia repair and some abdominal wall to bowel adhesions in the pelvis.  Additional ports were then very carefully placed as follows:  right far lateral 8-mm AirSeal assist port and right paramedian 8-mm robotic port.  Via these sites, adhesiolysis was performed using laparoscopic scissors focusing on the area of omental adhesions in the midline of the abdomen.  Fortunately, there were  no bowel adhesions in this area.  This was performed enough to allow placement of a midline port approximately 3 fingerbreadths superior to the umbilicus, and this is a camera port, and a left lateral 8-mm robotic port.  The robot was then docked and  passed the electronic checks.  Additional adhesiolysis was performed of several adhesions between his sigmoid colon and the anterior abdominal wall, allowing the sigmoid to rotate out of the pelvis which provided much better visualization.  Attention was then  directed to identification of the left ureter.  An incision was made lateral to the left medial umbilical ligament from the anterior abdomen coursing lateral to the left medial umbilical ligament and then superiorly just lateral to the  descending colon, creating a large left retroperitoneal flap which was then retracted medially.  The left ureter was encountered as it coursed over the iliac vessels and it was marked with a vessel loop, dissected proximally to a distance of  approximately 2 cm above the gonadal crossing and then circumferentially distally very, very carefully to the area of the ureterovesical junction.  In an area beginning approximately 2 cm inferior to the iliacs, there was some significant desmoplasia  around the ureter as anticipated but no obvious extra-ureteral disease.  I was quite happy with the safety of this and the completeness of this.  A purposely wide ureteral cystotomy was made beginning approximately 2 cm medial to the medial edge of the  ureter via an extravesical approach and the ureter was carefully excised with approximately 1 cm each direction of the ureteral orifice, completely removing the ureteral orifice with a grossly tumor-free bladder cuff.  Again, I was quite happy with the  oncologic completeness of this.  This small cystotomy was then closed first using a running 3-0 V-Lock incorporating the bladder mucosa and superficial muscularis and a separate imbricating layer at the level of the muscularis.  The bladder was then  filled to 210 mL and there  was excellent integrity with no large leak.  Attention was then directed at the lymph node dissection on the left side.  Given the location of the tumor and apposition to the common iliacs and distal, the left common iliac group was dissected free with the boundaries being aortic bifurcation, iliac  bifurcation, set aside, labelled as left common iliac lymph nodes.  Next, the left external group was dissected free with  the boundaries being left external iliac artery, vein, pelvic sidewall and lymphostasis was achieved with cold clips, set aside  labeled as left external iliac lymph nodes.  And finally, the left obturator group was dissected free with the boundaries being left external iliac vein, pelvic side wall, obturator nerve.  This inherently incorporated several of the internal iliac lymph  nodes as well.  Lymphostasis was again achieved with cold clips and the left obturator nerve was inspected following this and found to be uninjured.  This was set aside labeled left obturator lymph nodes.  The distal ureter was once again inspected and I chose to carefully transect this keeping the new stent in situ at a level corresponding to approximately 1 cm above the iliac crossing.  This appeared based on all imaging to be well away from the proximal  extent of his tumor.  Grossly, this appeared quite favorable, and this distal ureter and bladder cuff specimen was set aside for permanent pathology.  Frozen section was sent from the proximal ureter.  This was negative for obvious carcinoma.  This was  spatulated for a distance of approximately 1 cm.    Laying out the orientation of the ureter and bladder dome, it was felt that a psoas hitch would result in excellent geometry without undue tension.  As such, the space of Retzius was developed additionally anteriorly and then to the right side just  enough to allow hitching of the bladder to the left psoas musculature.  This was performed using 2-0 Vicryl suture superficially through the psoas tendon x 2, and as anticipated, this allowed for excellent geometry for left ureteral implant.  A small  cystotomy was then made approximately 4 mm in length of the left dome of the bladder and a 4-0 Vicryl heel stitch was applied between this and the heel of the ureter.  The stent was delivered into the bladder via the small superior cystotomy and further  ureter to bladder  anastomosis was performed using two separate running suture lines of 4-0 Vicryl.  I was quite happy with the tension-free geometry of this.  We achieved the goals of the extirpative portion of the procedure today as well as the reconstructive portion.  Hemostasis was excellent.  Sponge and needle counts were correct.  The prior ureter and bladder cuff specimen was able to be removed via the  12-mm assist port site that did not require any further opening of any incisions.  As such, the right lateral most assist port site was closed at the level of the fascial using a Carter-Thomason suture passer using 0 Vicryl.  A closed suction drain was  brought through the left lateral most robotic site into the peritoneal cavity.  All incision sites were reapproximated with subcuticular 4-0 Monocryl, followed by Dermabond.  The procedure was then terminated.  The patient tolerated the procedure well.   No immediate periprocedural complications.  The patient was taken to post anesthesia care unit in stable condition.  Plan for inpatient admission.  Please note, first assistant, Alan Mcmurray, was crucial for all  portions of surgery today.  She provided invaluable retraction, suctioning, robotic clipping, robotic instrument exchange and general first assistance.   MUK D: 05/11/2024 3:20:47 pm T: 05/11/2024 11:40:00 pm  JOB: [id number]/ [id number]

## 2024-05-11 NOTE — Brief Op Note (Signed)
 05/11/2024  3:08 PM  PATIENT:  Alm FORBES Necessary  83 y.o. male  PRE-OPERATIVE DIAGNOSIS:  LEFT URETERAL CANCER  POST-OPERATIVE DIAGNOSIS:  LEFT URETERAL CANCER  PROCEDURE:  Procedures: REIMPLANTATION, URETER, ROBOT-ASSISTED, LAPAROSCOPIC (N/A) CYSTOSCOPY WITH INDOCYANINE GREEN  IMAGING (ICG) (Left)  SURGEON:  Surgeons and Role:    * Manny, Ricardo KATHEE Raddle., MD - Primary  PHYSICIAN ASSISTANT:   ASSISTANTS: Alan Hammonds PA   ANESTHESIA:   local and general  EBL:  200 mL   BLOOD ADMINISTERED:none  DRAINS: 1 - JP to bulb; 2 - Foley to gravity   LOCAL MEDICATIONS USED:  MARCAINE      SPECIMEN:  Source of Specimen:  Left distal ureter; Ureteral margin. Left pelvic lymph nodes  DISPOSITION OF SPECIMEN:  PATHOLOGY  COUNTS:  YES  TOURNIQUET:  * No tourniquets in log *  DICTATION: .Other Dictation: Dictation Number [id number]  PLAN OF CARE: Admit for overnight observation  PATIENT DISPOSITION:  PACU - hemodynamically stable.   Delay start of Pharmacological VTE agent (>24hrs) due to surgical blood loss or risk of bleeding: yes

## 2024-05-11 NOTE — Consult Note (Incomplete)
 [facility] Medical Consultation  Eric [name] FMW:[id number] DOB: 08/09/1940 DOA: 05/11/2024 PCP: Nichole Senior, MD   Requesting physician: Dr. Delia Date of consultation: 05/11/2024  Reason for consultation:   Type 1 diabetes mellitus on insulin  therapy now with hyperglycemia   Impression/Recommendations    ____________________________  Assessment/Plan 84 yo M with hx of insulin -dependent diabetes type 1 requiring insulin  pump, HLD, HTN prostate cancer, melanoma, OSA, CKD stage IIIb   Admitted for  Ureteral mass, hospitalist was consulted for management of postoperative hyperglycemia in DM1    Present on Admission:  Ureteral mass  Uncontrolled type 1 diabetes mellitus with hyperglycemia, with long-term current use of insulin  (HCC)  Lactic acidosis  Essential hypertension  Hyperlipidemia  DKA, type 1, not at goal [facility])    Uncontrolled type 1 diabetes mellitus with hyperglycemia, with long-term current use of insulin  (HCC) Given prolonged period time without Basaglar  insulin  Slight anion gap increase of 19 will evaluate for early DKA VBG showing no evidence of acidosis Anion gap improving with insulin  administration and IV fluids Check beta hydroxybutyric acid, check UA for presence of ketones check lactic acid level noted to be elevated ( may have contributed to metabolic acidosis) If evidence of mild DKA patient will benefit from transitioning to insulin  drip with continued rehydration to stabilize blood sugar management. If no evidence of DKA continue every 4 hours CBG with coverage And add 10 units of  lantus  for tonight.    Obtain records ASAP regarding patient pump setting and daily insulin  requirement Diabetes coordinator consult to help with pump management. Based on Chart review Insulin  Pump Type: Medtronic 780G Using humalog As of October Infusion Sets: QuickSet  CGM: none  Basal Settings: 12a 0.8, 4:30a 1.8, 7a 1.0, 11:30a 0.6, 2p 0.9, 9:30p  0.95  Carb Ratio: 12a 7  Insulin  Pump Sensitivity: 20  BG Target: 120      Once stable BG and patient tolerating PO would recommend transition to insulin  pump   1:46 AM Beta-hydroxybutyric acid significantly elevated consistent with DKA with transition to insulin  drip  Lactic acidosis Mild lactic acidosis likely contributing to mixed picture If no evidence of ketosis  Lactic acidosis may explain mild metabolic acidosis noted on labs Will rehydrate and follow  Essential hypertension Continue Norvasc  5 mg a day  Hyperlipidemia Continue Lipitor 40 mg a day  Ureteral mass As per primary team  DKA, type 1, not at goal [facility]) Beta hydroxybutyric acid resulted and noted to be significant elevated Will transfer patient to stepdown on insulin  drip Rehydrate Keep n.p.o. Start  DKA  protocol, obtain serial BMET, start on glucosestabalizer, aggressive IVF.   Change IVF to D5 1/2Na after BG <250  Monitor in Stepdown. Replace potassium as needed.   Hold off  insulin  pump while on the drip.  Consult diabetes coordinator    I will followup again tomorrow. Please contact me if I can be of assistance in the meanwhile. Thank you for this consultation.  Chief Complaint: Hyperglycemia  HPI: 83 yo M with hx of insulin -dependent diabetes type 1 requiring insulin  pump, HLD, HTN prostate cancer, melanoma, OSA, CKD stage IIIb Patient today undergone left robotic distal ureterectomy / reimplant for large volume distal ureteral cancer  He is admitted to urology team patient has been n.p.o. His insulin  pump has been taken off in anticipation of surgical procedure.  Postoperatively he was started on sliding sliding scale AC and at bedtime.  He is family did state that they do not have insulin   for his insulin  pump and given patient's inconsistent p.o. intake insulin  pump was not restarted.  It is possible patient has had a prolonged period of time without any insulin  dosages.  They did notice today  that he was not acting at his baseline  His vision was slightly blurry which was transient and since has resolved which usually happens when his blood sugar goes up blood sugar was checked and was in the 300s  Wife states he does not have an automatic monitor  He sees Dr Nichole in [place] for his DM1 Family do not know what of the settings for the insulin  pump or whether he gets a basal or insulin  administration  He uses sliding scale carb based. He puts the amount of carbs he had and enters his BG level and pump delivers the correct amount of insulin .  Lately his blood glucose has been between 78 to 215 Family been very vigilant to monitor his carb intake    Review of Systems:,   Pertinent positives include:    Constitutional:  No weight loss, night sweats, Fevers, chills, weight loss fatigue  HEENT:  No headaches, Difficulty swallowing,Tooth/dental problems,Sore throat,  No sneezing, itching, ear ache, nasal congestion, post nasal drip,  Cardio-vascular:  No chest pain, Orthopnea, PND, anasarca, dizziness, palpitations.no Bilateral lower extremity swelling  GI:  No heartburn, indigestion, abdominal pain, nausea, vomiting, diarrhea, change in bowel habits, loss of appetite, melena, blood in stool, hematemesis Resp:  No shortness of breath at rest. No dyspnea on exertion,No excess mucus, no productive cough, No non-productive cough, No coughing up of blood.No change in color of mucus.No wheezing. Skin:  no rash or lesions. No jaundice GU:  no dysuria, change in color of urine, no urgency or frequency. No straining to urinate.  No flank pain.  Musculoskeletal:  No joint pain or no joint swelling. No decreased range of motion. No back pain.  Psych:  No change in mood or affect. No depression or anxiety. No memory loss.  Neuro: no localizing neurological complaints, no tingling, no weakness, no double vision, no gait abnormality, no slurred speech, no confusion   All systems  reviewed and apart from HOPI all are negative _______________________________________________________________________________________________ Past Medical History:   Past Medical History:  Diagnosis Date   Anxiety    Arthritis    Diabetes mellitus    insulin  pump   Hearing problem    History of blurred vision    Hyperlipidemia    Hypertension    Impotence of organic origin    Malignant neoplasm prostate (HCC)    Nocturia    Right shoulder pain    due to prior surgery   Shoulder injury    continues to have occ pain   Skin melanoma (HCC)    to chest, ahs been removed   Sleep apnea    Snoring disorder    loud  per wife.    Stress incontinence, male    Type I diabetes mellitus (HCC)    Ventral hernia    Past Surgical History:  Procedure Laterality Date   CYSTOSCOPY  01/06/2012   Procedure: CYSTOSCOPY FLEXIBLE;  Surgeon: Arlena LILLETTE Gal, MD;  Location: [facility] OR;  Service: Urology;  Laterality: N/A;   CYSTOSCOPY WITH URETHRAL DILATATION  01/06/2012   Procedure: CYSTOSCOPY WITH URETHRAL DILATATION;  Surgeon: Arlena LILLETTE Gal, MD;  Location: [facility] OR;  Service: Urology;  Laterality: N/A;  insertion of foley catheter   EYE SURGERY  04/28/2011   macular pucker  HERNIA REPAIR     PROSTATE SURGERY  02/23/2010   ROTATOR CUFF REPAIR Right    TRANSURETHRAL RESECTION OF BLADDER TUMOR N/A 03/02/2024   Procedure: TURBT (TRANSURETHRAL RESECTION OF BLADDER TUMOR);  Surgeon: Lovie Arlyss CROME, MD;  Location: [facility] ORS;  Service: Urology;  Laterality: N/A;   URETEROSCOPY Left 03/02/2024   Procedure: DIAGNOSTIC URETEROSCOPY/STENT PLACEMENT;  Surgeon: Lovie Arlyss CROME, MD;  Location: [facility] ORS;  Service: Urology;  Laterality: Left;   VENTRAL HERNIA REPAIR  01/06/2012   Procedure: LAPAROSCOPIC VENTRAL HERNIA;  Surgeon: Deward GORMAN Curvin DOUGLAS, MD;  Location: [facility] OR;  Service: General;  Laterality: N/A;    Social History:  Ambulatory   independently      reports that he has never smoked. He has  never used smokeless tobacco. He reports that he does not drink alcohol  and does not use drugs.    Family History:   Family History  Problem Relation Age of Onset   Stroke Mother    Heart disease Father    Cancer Unknown    Heart disease Unknown    Diabetes Unknown    ______________________________________________________________________________________________ Allergies: Allergies[1]   Prior to Admission medications  Medication Sig Start Date End Date Taking? Authorizing Provider  amLODipine  (NORVASC ) 5 MG tablet Take 5 mg by mouth in the morning. 11/26/11  Yes [provider]  aspirin  EC 81 MG tablet Take 81 mg by mouth in the morning.   Yes [provider]  atorvastatin  (LIPITOR) 40 MG tablet Take 40 mg by mouth in the morning.   Yes [provider]  citalopram  (CELEXA ) 20 MG tablet Take 20 mg by mouth in the morning.   Yes [provider]  docusate sodium  (COLACE) 100 MG capsule Take 1 capsule (100 mg total) by mouth 2 (two) times daily. 05/11/24  Yes Dancy, Amanda, PA-C  HUMALOG 100 UNIT/ML injection Inject into the skin as directed. Up to 115 units a day per sliding scale via insulin  pump   Yes [provider]  HYDROcodone -acetaminophen  (NORCO/VICODIN) 5-325 MG tablet Take 1-2 tablets by mouth every 6 (six) hours as needed for moderate pain (pain score 4-6) or severe pain (pain score 7-10). 05/11/24  Yes Dancy, Alan, PA-C  levothyroxine  (SYNTHROID ) 75 MCG tablet Take 75 mcg by mouth daily before breakfast.   Yes [provider]  LORazepam (ATIVAN) 0.5 MG tablet Take 0.5 mg by mouth 2 (two) times daily. 04/14/24  Yes [provider]  mirtazapine  (REMERON ) 15 MG tablet Take 15 mg by mouth at bedtime.   Yes [provider]  olmesartan (BENICAR) 20 MG tablet Take 20 mg by mouth daily. 04/14/24  Yes [provider]  omeprazole (PRILOSEC) 40 MG capsule Take 40 mg by mouth daily before breakfast.   Yes  [provider]  sulfamethoxazole -trimethoprim  (BACTRIM  DS) 800-160 MG tablet Take 1 tablet by mouth 2 (two) times daily. Start the day prior to foley removal appointment 05/11/24  Yes Dancy, Alan, PA-C  busPIRone (BUSPAR) 5 MG tablet Take 5 mg by mouth 2 (two) times daily. Patient not taking: Reported on 04/19/2024 04/07/24   [provider]   Physical Exam: Blood pressure (!) 109/54, pulse 89, temperature 98.3 F (36.8 C), resp. rate 19, height 5' 7 (1.702 m), weight 79.8 kg, SpO2 100%. Vitals:   05/11/24 1900 05/11/24 1927  BP: (!) 172/62 (!) 109/54  Pulse: 81 89  Resp: 13 19  Temp:  98.3 F (36.8 C)  SpO2: 98% 100%  1. General:  in No Acute distress  Chronically ill   -appearing 2. Psychological: Alert and   Oriented 3. Head/ENT:    ry Mucous Membranes                          Head Non traumatic, neck supple                           Poor Dentition 4. SKIN: normal     Skin turgor,  Skin clean Dry and intact no rash 5. Heart: Regular rate and rhythm no  Murmur, no Rub or gallop 6. Lungs:  Clear to auscultation bilaterally, no wheezes or crackles   7. Abdomen: Soft, appropriately tender, Non distended   obese   8. Lower extremities: no clubbing, cyanosis, or  edema 9.  strength 5 out of 5 in all 4 extremities cranial nerves II through XII intact, of note, there is a symmetry to patient's mouth but that secondary to missing dentures patient's family states that chronic 10. MSK: Normal range of motion  Labs reviewed:  Basic Metabolic Panel: Recent Labs  Lab 05/11/24 2220  NA 134*  K 4.6  CL 96*  CO2 20*  GLUCOSE 367*  BUN 25*  CREATININE 1.77*  CALCIUM  9.1   Liver Function Tests: Recent Labs  Lab 05/11/24 2220  AST 25  ALT 19  ALKPHOS 91  BILITOT 0.5  PROT 6.9  ALBUMIN  4.1   CBC: Recent Labs  Lab 05/11/24 1617  HGB 12.2*  HCT 35.8*   Cardiac Enzymes: No results for input(s): CKTOTAL, CKMB, CKMBINDEX, TROPONINI in the  last 168 hours. BNP: Invalid input(s): POCBNP CBG: Recent Labs  Lab 05/11/24 1433 05/11/24 1538 05/11/24 1854 05/11/24 1945 05/11/24 2043  GLUCAP 163* 169* 286* 306* 303*       WBC     Component Value Date/Time   WBC 7.8 04/28/2024 1359   LYMPHSABS 0.4 (L) 03/22/2009 0530   MONOABS 0.6 03/22/2009 0530   EOSABS 0.0 03/22/2009 0530   BASOSABS 0.0 03/22/2009 0530       Results for orders placed or performed during the hospital encounter of 12/25/11  Surgical pcr screen     Status: None   Collection Time: 12/25/11  9:03 AM   Specimen: Nasal Mucosa; Nasal Swab  Result Value Ref Range Status   MRSA, PCR NEGATIVE NEGATIVE Final   Staphylococcus aureus NEGATIVE NEGATIVE Final    Comment:        The Xpert SA Assay (FDA approved for NASAL specimens only), is one component of a comprehensive surveillance program.  It is not intended to diagnose infection nor to guide or monitor treatment.    ABX started Antibiotics Given (last 72 hours)     Date/Time Action Medication Dose   05/11/24 1225 Given   ceFAZolin  (ANCEF ) IVPB 2g/100 mL premix 2 g       __________________________________________________________ Recent Labs  Lab 05/11/24 2220  NA 134*  K 4.6  CO2 20*  GLUCOSE 367*  BUN 25*  CREATININE 1.77*  CALCIUM  9.1    Cr  down from baseline see below Lab Results  Component Value Date   CREATININE 1.57 (H) 04/28/2024   CREATININE 2.30 (H) 03/02/2024   CREATININE 2.24 (H) 03/02/2024    Recent Labs  Lab 05/11/24 2220  AST 25  ALT 19  ALKPHOS 91  BILITOT 0.5  PROT 6.9  ALBUMIN  4.1   Lab  Results  Component Value Date   CALCIUM  9.4 04/28/2024    Plt: Lab Results  Component Value Date   PLT 198 04/28/2024       Recent Labs  Lab 05/11/24 1617  HGB 12.2*  HCT 35.8*    HG/HCT    Down  from baseline see below    Component Value Date/Time   HGB 12.2 (L) 05/11/2024 1617   HCT 35.8 (L) 05/11/2024 1617   MCV 89.4 04/28/2024 1359     DM   labs:  HbA1C: Recent Labs    02/23/24 1344 04/28/24 1359  HGBA1C 6.8* 7.2*      CBG (last 3)  Recent Labs    05/11/24 1538 05/11/24 1854 05/11/24 1945  GLUCAP 169* 286* 306*      Radiological Exams on Admission: DG C-Arm 1-60 Min-No Report Result Date: 05/11/2024 Fluoroscopy was utilized by the requesting physician.  No radiographic interpretation.    Time spent: 90 min  Blease Quiver [facility] Pager [phone]  If 7PM-7AM, please contact night-coverage [email or web address] Password [email or web address] 05/12/2024, 1:25 AM         [1] No Known Allergies

## 2024-05-12 ENCOUNTER — Encounter (HOSPITAL_COMMUNITY): Payer: Self-pay | Admitting: Urology

## 2024-05-12 DIAGNOSIS — E785 Hyperlipidemia, unspecified: Secondary | ICD-10-CM | POA: Diagnosis present

## 2024-05-12 DIAGNOSIS — N1832 Chronic kidney disease, stage 3b: Secondary | ICD-10-CM | POA: Diagnosis present

## 2024-05-12 DIAGNOSIS — F05 Delirium due to known physiological condition: Secondary | ICD-10-CM | POA: Diagnosis not present

## 2024-05-12 DIAGNOSIS — I129 Hypertensive chronic kidney disease with stage 1 through stage 4 chronic kidney disease, or unspecified chronic kidney disease: Secondary | ICD-10-CM | POA: Diagnosis present

## 2024-05-12 DIAGNOSIS — Z85820 Personal history of malignant melanoma of skin: Secondary | ICD-10-CM | POA: Diagnosis not present

## 2024-05-12 DIAGNOSIS — E872 Acidosis, unspecified: Secondary | ICD-10-CM | POA: Diagnosis present

## 2024-05-12 DIAGNOSIS — C662 Malignant neoplasm of left ureter: Secondary | ICD-10-CM | POA: Diagnosis present

## 2024-05-12 DIAGNOSIS — Z9641 Presence of insulin pump (external) (internal): Secondary | ICD-10-CM | POA: Diagnosis present

## 2024-05-12 DIAGNOSIS — E1010 Type 1 diabetes mellitus with ketoacidosis without coma: Secondary | ICD-10-CM | POA: Diagnosis present

## 2024-05-12 DIAGNOSIS — Z79890 Hormone replacement therapy (postmenopausal): Secondary | ICD-10-CM | POA: Diagnosis not present

## 2024-05-12 DIAGNOSIS — Z8249 Family history of ischemic heart disease and other diseases of the circulatory system: Secondary | ICD-10-CM | POA: Diagnosis not present

## 2024-05-12 DIAGNOSIS — Z79899 Other long term (current) drug therapy: Secondary | ICD-10-CM | POA: Diagnosis not present

## 2024-05-12 DIAGNOSIS — G4733 Obstructive sleep apnea (adult) (pediatric): Secondary | ICD-10-CM | POA: Diagnosis present

## 2024-05-12 DIAGNOSIS — F39 Unspecified mood [affective] disorder: Secondary | ICD-10-CM | POA: Diagnosis present

## 2024-05-12 DIAGNOSIS — Z794 Long term (current) use of insulin: Secondary | ICD-10-CM | POA: Diagnosis not present

## 2024-05-12 DIAGNOSIS — E1065 Type 1 diabetes mellitus with hyperglycemia: Secondary | ICD-10-CM | POA: Diagnosis present

## 2024-05-12 DIAGNOSIS — E1022 Type 1 diabetes mellitus with diabetic chronic kidney disease: Secondary | ICD-10-CM | POA: Diagnosis present

## 2024-05-12 DIAGNOSIS — F419 Anxiety disorder, unspecified: Secondary | ICD-10-CM | POA: Diagnosis present

## 2024-05-12 DIAGNOSIS — Z823 Family history of stroke: Secondary | ICD-10-CM | POA: Diagnosis not present

## 2024-05-12 DIAGNOSIS — Z8546 Personal history of malignant neoplasm of prostate: Secondary | ICD-10-CM | POA: Diagnosis not present

## 2024-05-12 DIAGNOSIS — I10 Essential (primary) hypertension: Secondary | ICD-10-CM | POA: Diagnosis present

## 2024-05-12 DIAGNOSIS — Z9079 Acquired absence of other genital organ(s): Secondary | ICD-10-CM | POA: Diagnosis not present

## 2024-05-12 DIAGNOSIS — E039 Hypothyroidism, unspecified: Secondary | ICD-10-CM | POA: Diagnosis present

## 2024-05-12 DIAGNOSIS — N2889 Other specified disorders of kidney and ureter: Secondary | ICD-10-CM | POA: Diagnosis present

## 2024-05-12 LAB — BASIC METABOLIC PANEL WITH GFR
Anion gap: 10 (ref 5–15)
Anion gap: 12 (ref 5–15)
Anion gap: 16 — ABNORMAL HIGH (ref 5–15)
Anion gap: 9 (ref 5–15)
BUN: 27 mg/dL — ABNORMAL HIGH (ref 8–23)
BUN: 27 mg/dL — ABNORMAL HIGH (ref 8–23)
BUN: 28 mg/dL — ABNORMAL HIGH (ref 8–23)
BUN: 28 mg/dL — ABNORMAL HIGH (ref 8–23)
CO2: 20 mmol/L — ABNORMAL LOW (ref 22–32)
CO2: 23 mmol/L (ref 22–32)
CO2: 24 mmol/L (ref 22–32)
CO2: 24 mmol/L (ref 22–32)
Calcium: 8.6 mg/dL — ABNORMAL LOW (ref 8.9–10.3)
Calcium: 8.8 mg/dL — ABNORMAL LOW (ref 8.9–10.3)
Calcium: 8.8 mg/dL — ABNORMAL LOW (ref 8.9–10.3)
Calcium: 8.9 mg/dL (ref 8.9–10.3)
Chloride: 100 mmol/L (ref 98–111)
Chloride: 101 mmol/L (ref 98–111)
Chloride: 102 mmol/L (ref 98–111)
Chloride: 96 mmol/L — ABNORMAL LOW (ref 98–111)
Creatinine, Ser: 1.74 mg/dL — ABNORMAL HIGH (ref 0.61–1.24)
Creatinine, Ser: 1.79 mg/dL — ABNORMAL HIGH (ref 0.61–1.24)
Creatinine, Ser: 1.8 mg/dL — ABNORMAL HIGH (ref 0.61–1.24)
Creatinine, Ser: 1.86 mg/dL — ABNORMAL HIGH (ref 0.61–1.24)
GFR, Estimated: 36 mL/min — ABNORMAL LOW (ref >60)
GFR, Estimated: 37 mL/min — ABNORMAL LOW (ref >60)
GFR, Estimated: 38 mL/min — ABNORMAL LOW (ref >60)
GFR, Estimated: 39 mL/min — ABNORMAL LOW (ref >60)
Glucose, Bld: 150 mg/dL — ABNORMAL HIGH (ref 70–99)
Glucose, Bld: 169 mg/dL — ABNORMAL HIGH (ref 70–99)
Glucose, Bld: 277 mg/dL — ABNORMAL HIGH (ref 70–99)
Glucose, Bld: 366 mg/dL — ABNORMAL HIGH (ref 70–99)
Potassium: 4.1 mmol/L (ref 3.5–5.1)
Potassium: 4.3 mmol/L (ref 3.5–5.1)
Potassium: 4.5 mmol/L (ref 3.5–5.1)
Potassium: 4.9 mmol/L (ref 3.5–5.1)
Sodium: 132 mmol/L — ABNORMAL LOW (ref 135–145)
Sodium: 133 mmol/L — ABNORMAL LOW (ref 135–145)
Sodium: 135 mmol/L (ref 135–145)
Sodium: 136 mmol/L (ref 135–145)

## 2024-05-12 LAB — URINALYSIS, COMPLETE (UACMP) WITH MICROSCOPIC
Bilirubin Urine: NEGATIVE
Glucose, UA: >=500 mg/dL — AB
Ketones, ur: 5 mg/dL — AB
Leukocytes,Ua: NEGATIVE
Nitrite: NEGATIVE
Protein, ur: >=300 mg/dL — AB
RBC / HPF: >50 RBC/hpf (ref 0–5)
Specific Gravity, Urine: 1.015 (ref 1.005–1.030)
pH: 5 (ref 5.0–8.0)

## 2024-05-12 LAB — GLUCOSE, CAPILLARY
Glucose-Capillary: 270 mg/dL — ABNORMAL HIGH (ref 70–99)
Glucose-Capillary: 295 mg/dL — ABNORMAL HIGH (ref 70–99)
Glucose-Capillary: 218 mg/dL — ABNORMAL HIGH (ref 70–99)
Glucose-Capillary: 215 mg/dL — ABNORMAL HIGH (ref 70–99)
Glucose-Capillary: 183 mg/dL — ABNORMAL HIGH (ref 70–99)
Glucose-Capillary: 168 mg/dL — ABNORMAL HIGH (ref 70–99)
Glucose-Capillary: 134 mg/dL — ABNORMAL HIGH (ref 70–99)
Glucose-Capillary: 137 mg/dL — ABNORMAL HIGH (ref 70–99)
Glucose-Capillary: 143 mg/dL — ABNORMAL HIGH (ref 70–99)
Glucose-Capillary: 237 mg/dL — ABNORMAL HIGH (ref 70–99)
Glucose-Capillary: 345 mg/dL — ABNORMAL HIGH (ref 70–99)
Glucose-Capillary: 233 mg/dL — ABNORMAL HIGH (ref 70–99)

## 2024-05-12 LAB — PHOSPHORUS: Phosphorus: 3.5 mg/dL (ref 2.5–4.6)

## 2024-05-12 LAB — HEMOGLOBIN AND HEMATOCRIT, BLOOD
HCT: 33.8 % — ABNORMAL LOW (ref 39.0–52.0)
Hemoglobin: 11.1 g/dL — ABNORMAL LOW (ref 13.0–17.0)

## 2024-05-12 LAB — LACTIC ACID, PLASMA
Lactic Acid, Venous: 2.1 mmol/L (ref 0.5–1.9)
Lactic Acid, Venous: 2.3 mmol/L (ref 0.5–1.9)

## 2024-05-12 LAB — BETA-HYDROXYBUTYRIC ACID
Beta-Hydroxybutyric Acid: >8 mmol/L — ABNORMAL HIGH (ref 0.05–0.27)
Beta-Hydroxybutyric Acid: <0.05 mmol/L — ABNORMAL LOW (ref 0.05–0.27)
Beta-Hydroxybutyric Acid: 0.08 mmol/L (ref 0.05–0.27)
Beta-Hydroxybutyric Acid: 0.16 mmol/L (ref 0.05–0.27)

## 2024-05-12 LAB — HEMOGLOBIN A1C
Hgb A1c MFr Bld: 7.3 % — ABNORMAL HIGH (ref 4.8–5.6)
Mean Plasma Glucose: 162.81 mg/dL

## 2024-05-12 LAB — MRSA NEXT GEN BY PCR, NASAL: MRSA by PCR Next Gen: NOT DETECTED

## 2024-05-12 LAB — MAGNESIUM: Magnesium: 2.3 mg/dL (ref 1.7–2.4)

## 2024-05-12 MED ORDER — DEXTROSE IN LACTATED RINGERS 5 % IV SOLN: INTRAVENOUS | Status: DC

## 2024-05-12 MED ORDER — DEXTROSE 50 % IV SOLN: 0.0000 mL | INTRAVENOUS | Status: DC | PRN

## 2024-05-12 MED ORDER — LACTATED RINGERS IV SOLN: INTRAVENOUS | Status: DC

## 2024-05-12 MED ORDER — INSULIN ASPART 100 UNIT/ML IJ SOLN: 0.0000 [IU] | Freq: Three times a day (TID) | INTRAMUSCULAR | Status: DC

## 2024-05-12 MED ORDER — INSULIN GLARGINE 100 UNIT/ML ~~LOC~~ SOLN
20.0000 [IU] | Freq: Every day | SUBCUTANEOUS | Status: DC
  Administered 2024-05-12: 11:00:00 20 [IU] via SUBCUTANEOUS
  Filled 2024-05-12 (×2): qty 0.2

## 2024-05-12 MED ORDER — INSULIN ASPART 100 UNIT/ML IJ SOLN
0.0000 [IU] | Freq: Three times a day (TID) | INTRAMUSCULAR | Status: DC
  Administered 2024-05-12: 18:00:00 7 [IU] via SUBCUTANEOUS
  Administered 2024-05-12: 13:00:00 3 [IU] via SUBCUTANEOUS
  Administered 2024-05-13 (×2): 7 [IU] via SUBCUTANEOUS
  Administered 2024-05-13: 5 [IU] via SUBCUTANEOUS
  Administered 2024-05-14: 9 [IU] via SUBCUTANEOUS
  Administered 2024-05-14: 7 [IU] via SUBCUTANEOUS
  Filled 2024-05-12 (×3): qty 7
  Filled 2024-05-12: qty 3
  Filled 2024-05-12: qty 5
  Filled 2024-05-12: qty 7
  Filled 2024-05-12: qty 3

## 2024-05-12 MED ORDER — SODIUM CHLORIDE 0.9 % IV BOLUS
500.0000 mL | Freq: Once | INTRAVENOUS | Status: AC
  Administered 2024-05-12: 01:00:00 500 mL via INTRAVENOUS

## 2024-05-12 MED ORDER — INSULIN ASPART 100 UNIT/ML IJ SOLN
3.0000 [IU] | Freq: Three times a day (TID) | INTRAMUSCULAR | Status: DC
  Administered 2024-05-12 – 2024-05-13 (×3): 3 [IU] via SUBCUTANEOUS
  Filled 2024-05-12 (×2): qty 3

## 2024-05-12 MED ORDER — INSULIN ASPART 100 UNIT/ML IJ SOLN: 7.0000 [IU] | Freq: Once | INTRAMUSCULAR | Status: DC

## 2024-05-12 MED ORDER — CHLORHEXIDINE GLUCONATE CLOTH 2 % EX PADS
6.0000 | MEDICATED_PAD | Freq: Every day | CUTANEOUS | Status: DC
  Administered 2024-05-12 – 2024-05-15 (×4): 6 via TOPICAL

## 2024-05-12 MED ORDER — INSULIN REGULAR(HUMAN) IN NACL 100-0.9 UT/100ML-% IV SOLN
INTRAVENOUS | Status: DC
  Administered 2024-05-12: 03:00:00 8.5 [IU]/h via INTRAVENOUS
  Filled 2024-05-12 (×2): qty 100

## 2024-05-12 MED ORDER — INSULIN ASPART 100 UNIT/ML IJ SOLN: 10.0000 [IU] | Freq: Once | INTRAMUSCULAR | Status: DC

## 2024-05-12 MED ORDER — INSULIN ASPART 100 UNIT/ML IJ SOLN
0.0000 [IU] | Freq: Every day | INTRAMUSCULAR | Status: DC
  Administered 2024-05-12: 22:00:00 2 [IU] via SUBCUTANEOUS
  Administered 2024-05-13: 3 [IU] via SUBCUTANEOUS
  Filled 2024-05-12 (×2): qty 3

## 2024-05-12 NOTE — Inpatient Diabetes Management (Signed)
 Inpatient Diabetes Program Recommendations  AACE/ADA: New Consensus Statement on Inpatient Glycemic Control (2015)  Target Ranges:  Prepandial:   less than 140 mg/dL      Peak postprandial:   less than 180 mg/dL (1-2 hours)      Critically ill patients:  140 - 180 mg/dL   Lab Results  Component Value Date   GLUCAP 137 (H) 05/12/2024   HGBA1C 7.3 (H) 05/11/2024    Review of Glycemic Control  Diabetes history: DM1(does not make insulin .  Needs correction, basal and meal coverage)  Outpatient Diabetes medications:  Medtronic insulin  pump Basal-23.225 units/day Insulin  Carb ratio-1 unit for every 7 CHO's ISF-20 mg/dL Target 879 mg/dL  Current orders for Inpatient glycemic control: IV insulin   Inpatient Diabetes Program Recommendations:    When MD is ready to transition to SQ insulin  please consider:  1-Semglee  20 units every day (allow IV insulin  to infuse for 2 hrs and then DC IV insulin )  2-Novolog  0-9 units TID and 0-5 units at bedtime  3-Novolog  3 units TID with meals if he consumes at least 50%  Met with patient and spouse at bedside.  He has had T1DM for 50 yrs.  He is current with endocrinology, Dr. Nichole and sees Nathanel Pinal for insulin  pump management.  He does not use a CGM; has a glucometer.    Patient tells me prior to surgery he was having difficulty remembering how to change his insulin  pump.  Wife does not know how to manage pump.  Patient tells me this morning he is confused about the pump and not sure he can place the pump back on on his own.    If patient cannot manage pump independently; I recommend transitioning off of IV insulin  to SQ basal bolus.  Discussed with patient and wife.  Wife really wants to have pump put back on; I explained to her, until he can manage the pump on his own it is not recommended.    Thank you, Wyvonna Pinal, MSN, CDCES Diabetes Coordinator Inpatient Diabetes Program [phone] (team pager from 8a-5p)

## 2024-05-12 NOTE — Progress Notes (Signed)
 PROGRESS NOTE    Eric [name]  FMW:[id number] DOB: 01-22-1941 DOA: 05/11/2024 PCP: Nichole Senior, MD   Brief Narrative:   83 yo M with hx of insulin -dependent diabetes type 1 requiring insulin  pump, HLD, HTN prostate cancer, melanoma, OSA, CKD stage IIIb, Admitted for Ureteral mass, hospitalist was consulted for management of postoperative hyperglycemia in DM1.  S/p (12/17) 1.Cystoscopy with left retrograde pyelogram interpretation and exchange of left ureteral stent. 2.  Left robotic distal ureterectomy with psoas hitch ureteral reimplantation. 3.  Pelvic lymph node dissection.  Started on IV insulin ,transitioned to subcutaneous insulin  on 12/18.    Assessment & Plan:  Principal Problem:   Ureteral mass Active Problems:   Uncontrolled type 1 diabetes mellitus with hyperglycemia, with long-term current use of insulin  (HCC)   Lactic acidosis   Essential hypertension   Hyperlipidemia   DKA, type 1, not at goal [facility])   Insulin  dependent Type 1 diabetes with hyperglycemia,POA: Mild DKA, resolved now -Insulin  pump was turned off in anticipation for the surgical procedure and he didn't get any insulin  resulting in severe hyperglycemia -Started on IV Insulin . Transitioned to subcutaneous insulin  on 12/18 -Started on Insulin  semglee  20 units daily (IV insulin  will be stopped 2 hours after semglee  administration) -Novolog  0-9 units TID and 0-5 units at bedtime  -Novolog  3 units TID with meals if he consumes at least 50%  -Patient can be restarted on his insulin  pump on discharge -Diabetes coordinator on board.  Hyperlipidemia: continue with lipitor  Hypertension: continue with amlodipine   Left distal ureteral cancer, high grade,POA:  s/p 1.Cystoscopy with left retrograde pyelogram interpretation and exchange of left ureteral stent. 2.  Left robotic distal ureterectomy with psoas hitch ureteral reimplantation. 3.  Pelvic lymph node dissection. -Management as per  Urologist  Disposition: Home. Lives with his wife  DVT prophylaxis: SCDs Start: 05/11/24 1934     Code Status: Full Code Family Communication:  Wife is at the bedside Status is: Observation The patient remains OBS appropriate and will d/c before 2 midnights.    Subjective:  No acute events overnight. Wife is present at the bedside, We spoke about inpatient management of diabetes.  Examination:  General exam: Appears calm and comfortable  Respiratory system: Clear to auscultation. Respiratory effort normal. Cardiovascular system: S1 & S2 heard, RRR. No JVD, murmurs, rubs, gallops or clicks. No pedal edema. Gastrointestinal system: incisions look good Central nervous system: Alert and oriented. No focal neurological deficits. Extremities: Symmetric 5 x 5 power. Skin: No rashes, lesions or ulcers Psychiatry: Judgement and insight appear normal. Mood & affect appropriate.       Diet Orders (From admission, onward)     Start     Ordered   05/12/24 0135  Diet NPO time specified  (Diabetes Ketoacidosis (DKA))  Diet effective now        05/12/24 0136            Objective: Vitals:   05/12/24 0837 05/12/24 0900 05/12/24 1000 05/12/24 1029  BP:  (!) 120/33 (!) 132/39 (!) 132/39  Pulse:  (!) 57 (!) 59   Resp:  13 18   Temp: 98.5 F (36.9 C)     TempSrc: Oral     SpO2:  97% 98%   Weight:      Height:        Intake/Output Summary (Last 24 hours) at 05/12/2024 1036 Last data filed at 05/12/2024 0900 Gross per 24 hour  Intake 3316.06 ml  Output 1270 ml  Net 2046.06  ml   Filed Weights   05/11/24 1010 05/12/24 0300  Weight: 79.8 kg 79.9 kg    Scheduled Meds:  acetaminophen   1,000 mg Oral Q6H   amLODipine   5 mg Oral q AM   atorvastatin   40 mg Oral q AM   Chlorhexidine  Gluconate Cloth  6 each Topical Daily   citalopram   20 mg Oral q AM   docusate sodium   100 mg Oral BID   feeding supplement  1 Container Oral TID BM   insulin  aspart  7 Units Subcutaneous Once    insulin  glargine  20 Units Subcutaneous Daily   levothyroxine   75 mcg Oral QAC breakfast   mirtazapine   15 mg Oral QHS   pantoprazole   80 mg Oral Daily   Continuous Infusions:  dextrose  5% lactated ringers  125 mL/hr at 05/12/24 0446   insulin  5.5 Units/hr (05/12/24 0446)   lactated ringers  Stopped (05/12/24 0331)    Nutritional status     Body mass index is 27.59 kg/m.  Data Reviewed:   CBC: Recent Labs  Lab 05/11/24 1617 05/11/24 2139 05/12/24 0731  WBC  --  16.7*  --   NEUTROABS  --  15.4*  --   HGB 12.2* 12.2* 11.1*  HCT 35.8* 37.1* 33.8*  MCV  --  92.1  --   PLT  --  195  --    Basic Metabolic Panel: Recent Labs  Lab 05/11/24 2220 05/12/24 0012 05/12/24 0202 05/12/24 0731 05/12/24 0926  NA 134* 132*  --  136 135  K 4.6 4.5  --  4.1 4.3  CL 96* 96*  --  101 102  CO2 20* 20*  --  23 24  GLUCOSE 367* 366*  --  150* 169*  BUN 25* 27*  --  28* 28*  CREATININE 1.77* 1.79*  --  1.86* 1.80*  CALCIUM  9.1 8.9  --  8.8* 8.6*  MG  --   --  2.3  --   --   PHOS  --   --  3.5  --   --    GFR: Estimated Creatinine Clearance: 32.6 mL/min (A) (by C-G formula based on SCr of 1.8 mg/dL (H)). Liver Function Tests: Recent Labs  Lab 05/11/24 2220  AST 25  ALT 19  ALKPHOS 91  BILITOT 0.5  PROT 6.9  ALBUMIN  4.1   No results for input(s): LIPASE, AMYLASE in the last 168 hours. No results for input(s): AMMONIA in the last 168 hours. Coagulation Profile: No results for input(s): INR, PROTIME in the last 168 hours. Cardiac Enzymes: No results for input(s): CKTOTAL, CKMB, CKMBINDEX, TROPONINI in the last 168 hours. BNP (last 3 results) No results for input(s): PROBNP in the last 8760 hours. HbA1C: Recent Labs    05/11/24 2139  HGBA1C 7.3*   CBG: Recent Labs  Lab 05/12/24 0423 05/12/24 0528 05/12/24 0628 05/12/24 0729 05/12/24 0835  GLUCAP 215* 183* 168* 134* 137*   Lipid Profile: No results for input(s): CHOL, HDL, LDLCALC,  TRIG, CHOLHDL, LDLDIRECT in the last 72 hours. Thyroid  Function Tests: No results for input(s): TSH, T4TOTAL, FREET4, T3FREE, THYROIDAB in the last 72 hours. Anemia Panel: No results for input(s): VITAMINB12, FOLATE, FERRITIN, TIBC, IRON, RETICCTPCT in the last 72 hours. Sepsis Labs: Recent Labs  Lab 05/12/24 0012 05/12/24 0731  LATICACIDVEN 2.3* 2.1*    Recent Results (from the past 240 hours)  MRSA Next Gen by PCR, Nasal     Status: None   Collection Time: 05/12/24  3:00 AM  Specimen: Nasal Mucosa; Nasal Swab  Result Value Ref Range Status   MRSA by PCR Next Gen NOT DETECTED NOT DETECTED Final    Comment: (NOTE) The GeneXpert MRSA Assay (FDA approved for NASAL specimens only), is one component of a comprehensive MRSA colonization surveillance program. It is not intended to diagnose MRSA infection nor to guide or monitor treatment for MRSA infections. Test performance is not FDA approved in patients less than 38 years old. Performed at [facility], [address] [place], [place] [address]          Radiology Studies: DG C-Arm 1-60 Min-No Report Result Date: 05/11/2024 Fluoroscopy was utilized by the requesting physician.  No radiographic interpretation.           LOS: 0 days   Time spent= 35 mins    Deliliah Room, MD [facility]  If 7PM-7AM, please contact night-coverage  05/12/2024, 10:36 AM

## 2024-05-12 NOTE — Progress Notes (Signed)
 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 Admitted to stepdown post-op as sugars challenge to control. POD 1 Cr 1.8 (baseline), Hgb 11, JP removed as output minimal.   2 - Diabetes - type 1 diabetic. Manages with insulin  pump at baseline that his son [name] helps manage. Initial plan of SSI only peri-op, but sugars remained high. Hospitalist team / DM coordinator managing in house with some basal and sliding scale on top peri-op.  Today Jayon is acceptable. Hospitalist team and diabetes coordinator graciously helping with glycemic management.    Objective: Vital signs in last 24 hours: Temp:  [97.9 F (36.6 C)-99 F (37.2 C)] 97.9 F (36.6 C) (12/18 1140) Pulse Rate:  [57-89] 63 (12/18 1300) Resp:  [13-21] 18 (12/18 1300) BP: (109-200)/(29-79) 162/37 (12/18 1300) SpO2:  [90 %-100 %] 98 % (12/18 1300) Weight:  [79.9 kg] 79.9 kg (12/18 0300) Last BM Date : 05/10/24  Intake/Output from previous day: 12/17 0701 - 12/18 0700 In: 3316.1 [P.O.:720; I.V.:1189.9; IV Piggyback:1406.2] Out: 670 [Urine:350; Drains:120; Blood:200] Intake/Output this shift: Total I/O In: 1134.8 [I.V.:1134.8] Out: 950 [Urine:900; Drains:50]   NAD, daughter in room. Some mild dementia as per baseline, hard of hearling. NLB-RA HR 60s and sinus on monitor Mild abd distension (gas), recent port sites c/d/I. JP removed as output scant and non-foul serosanguinous, dry dressing placed. Foley with clear urine   Lab Results:  Recent Labs    05/11/24 2139 05/12/24 0731  WBC 16.7*  --   HGB 12.2* 11.1*  HCT 37.1* 33.8*  PLT 195  --    BMET Recent Labs    05/12/24 0731 05/12/24 0926  NA 136 135  K 4.1 4.3  CL 101 102  CO2 23 24  GLUCOSE 150* 169*  BUN 28* 28*  CREATININE 1.86* 1.80*  CALCIUM  8.8* 8.6*    PT/INR No results for input(s): LABPROT, INR in the last 72 hours. ABG Recent Labs    05/12/24 0012  HCO3 23.2    Studies/Results: DG C-Arm 1-60 Min-No Report Result Date: 05/11/2024 Fluoroscopy was utilized by the requesting physician.  No radiographic interpretation.    Anti-infectives: Anti-infectives (From admission, onward)    Start     Dose/Rate Route Frequency Ordered Stop   05/11/24 1030  ceFAZolin  (ANCEF ) IVPB 2g/100 mL premix        2 g 200 mL/hr over 30 Minutes Intravenous 30 min pre-op 05/11/24 0930 05/11/24 1255   05/11/24 0000  sulfamethoxazole -trimethoprim  (BACTRIM  DS) 800-160 MG tablet        1 tablet Oral 2 times daily 05/11/24 1521         Assessment/Plan:  Doing acceptable POD 1 considering hs age and baseline functional status. GREATLY appreciate hospitalist and DM coordinator teams. Discusssed goal of ambulation today as long as no orthostasis and overall goals for DC. He will likely need a few days, mostly from his non-surgical issues.  Add CC diet.    Ricardo KATHEE Alvaro Mickey. 05/12/2024

## 2024-05-12 NOTE — Assessment & Plan Note (Signed)
Continue Lipitor 40 mg a day

## 2024-05-12 NOTE — Assessment & Plan Note (Signed)
 Mild lactic acidosis likely contributing to mixed picture If no evidence of ketosis  Lactic acidosis may explain mild metabolic acidosis noted on labs Will rehydrate and follow

## 2024-05-12 NOTE — Assessment & Plan Note (Signed)
 Beta hydroxybutyric acid resulted and noted to be significant elevated Will transfer patient to stepdown on insulin  drip Rehydrate Keep n.p.o. Start  DKA  protocol, obtain serial BMET, start on glucosestabalizer, aggressive IVF.   Change IVF to D5 1/2Na after BG <250  Monitor in Stepdown. Replace potassium as needed.   Hold off  insulin  pump while on the drip.  Consult diabetes coordinator

## 2024-05-12 NOTE — Progress Notes (Signed)
 Patient current BP is 123/30 (63), started 500cc NS bolus per protocol, informed Dr. Dino of this finding. Patient asymptomatic at this time. Awaiting further instructions

## 2024-05-12 NOTE — Assessment & Plan Note (Addendum)
 Given prolonged period time without Basaglar  insulin  Slight anion gap increase of 19 will evaluate for early DKA VBG showing no evidence of acidosis Anion gap improving with insulin  administration and IV fluids Check beta hydroxybutyric acid, check UA for presence of ketones check lactic acid level noted to be elevated ( may have contributed to metabolic acidosis) If evidence of mild DKA patient will benefit from transitioning to insulin  drip with continued rehydration to stabilize blood sugar management. If no evidence of DKA continue every 4 hours CBG with coverage And add 10 units of  lantus  for tonight.    Obtain records ASAP regarding patient pump setting and daily insulin  requirement Diabetes coordinator consult to help with pump management. Based on Chart review Insulin  Pump Type: Medtronic 780G Using humalog As of October Infusion Sets: QuickSet  CGM: none  Basal Settings: 12a 0.8, 4:30a 1.8, 7a 1.0, 11:30a 0.6, 2p 0.9, 9:30p 0.95  Carb Ratio: 12a 7  Insulin  Pump Sensitivity: 20  BG Target: 120      Once stable BG and patient tolerating PO would recommend transition to insulin  pump   1:46 AM Beta-hydroxybutyric acid significantly elevated consistent with DKA with transition to insulin  drip

## 2024-05-12 NOTE — Assessment & Plan Note (Signed)
 As per primary team

## 2024-05-12 NOTE — Assessment & Plan Note (Signed)
Continue Norvasc 5 mg a day

## 2024-05-13 DIAGNOSIS — N2889 Other specified disorders of kidney and ureter: Secondary | ICD-10-CM | POA: Diagnosis not present

## 2024-05-13 LAB — BASIC METABOLIC PANEL WITH GFR
Anion gap: 12 (ref 5–15)
BUN: 26 mg/dL — ABNORMAL HIGH (ref 8–23)
CO2: 22 mmol/L (ref 22–32)
Calcium: 8.8 mg/dL — ABNORMAL LOW (ref 8.9–10.3)
Chloride: 100 mmol/L (ref 98–111)
Creatinine, Ser: 1.71 mg/dL — ABNORMAL HIGH (ref 0.61–1.24)
GFR, Estimated: 40 mL/min — ABNORMAL LOW
Glucose, Bld: 336 mg/dL — ABNORMAL HIGH (ref 70–99)
Potassium: 5 mmol/L (ref 3.5–5.1)
Sodium: 134 mmol/L — ABNORMAL LOW (ref 135–145)

## 2024-05-13 LAB — GLUCOSE, CAPILLARY
Glucose-Capillary: 232 mg/dL — ABNORMAL HIGH (ref 70–99)
Glucose-Capillary: 250 mg/dL — ABNORMAL HIGH (ref 70–99)
Glucose-Capillary: 253 mg/dL — ABNORMAL HIGH (ref 70–99)
Glucose-Capillary: 287 mg/dL — ABNORMAL HIGH (ref 70–99)
Glucose-Capillary: 303 mg/dL — ABNORMAL HIGH (ref 70–99)
Glucose-Capillary: 318 mg/dL — ABNORMAL HIGH (ref 70–99)
Glucose-Capillary: 337 mg/dL — ABNORMAL HIGH (ref 70–99)

## 2024-05-13 MED ORDER — INSULIN ASPART 100 UNIT/ML IJ SOLN
4.0000 [IU] | Freq: Once | INTRAMUSCULAR | Status: AC
  Administered 2024-05-13: 4 [IU] via SUBCUTANEOUS
  Filled 2024-05-13: qty 4

## 2024-05-13 MED ORDER — INSULIN ASPART 100 UNIT/ML IJ SOLN
6.0000 [IU] | Freq: Three times a day (TID) | INTRAMUSCULAR | Status: DC
  Administered 2024-05-13 – 2024-05-14 (×4): 6 [IU] via SUBCUTANEOUS
  Filled 2024-05-13 (×4): qty 6

## 2024-05-13 MED ORDER — INSULIN GLARGINE 100 UNIT/ML ~~LOC~~ SOLN
25.0000 [IU] | Freq: Every day | SUBCUTANEOUS | Status: DC
  Administered 2024-05-13: 25 [IU] via SUBCUTANEOUS
  Filled 2024-05-13: qty 0.25

## 2024-05-13 NOTE — Plan of Care (Signed)
 " Problem: Education: Goal: Knowledge of the procedure and recovery process will improve Outcome: Progressing   Problem: Bowel/Gastric: Goal: Gastrointestinal status for postoperative course will improve Outcome: Progressing   Problem: Pain Management: Goal: General experience of comfort will improve Outcome: Progressing   Problem: Skin Integrity: Goal: Demonstration of wound healing without infection will improve Outcome: Progressing   Problem: Urinary Elimination: Goal: Ability to avoid or minimize complications of infection will improve Outcome: Progressing Goal: Ability to achieve and maintain urine output will improve Outcome: Progressing Goal: Home care management will improve Outcome: Progressing   Problem: Education: Goal: Knowledge of General Education information will improve Description: Including pain rating scale, medication(s)/side effects and non-pharmacologic comfort measures Outcome: Progressing   Problem: Health Behavior/Discharge Planning: Goal: Ability to manage health-related needs will improve Outcome: Progressing   Problem: Clinical Measurements: Goal: Ability to maintain clinical measurements within normal limits will improve Outcome: Progressing Goal: Will remain free from infection Outcome: Progressing Goal: Diagnostic test results will improve Outcome: Progressing Goal: Respiratory complications will improve Outcome: Progressing Goal: Cardiovascular complication will be avoided Outcome: Progressing   Problem: Activity: Goal: Risk for activity intolerance will decrease Outcome: Progressing   Problem: Nutrition: Goal: Adequate nutrition will be maintained Outcome: Progressing   Problem: Coping: Goal: Level of anxiety will decrease Outcome: Progressing   Problem: Elimination: Goal: Will not experience complications related to bowel motility Outcome: Progressing Goal: Will not experience complications related to urinary  retention Outcome: Progressing   Problem: Pain Managment: Goal: General experience of comfort will improve and/or be controlled Outcome: Progressing   Problem: Safety: Goal: Ability to remain free from injury will improve Outcome: Progressing   Problem: Skin Integrity: Goal: Risk for impaired skin integrity will decrease Outcome: Progressing   Problem: Education: Goal: Ability to describe self-care measures that may prevent or decrease complications (Diabetes Survival Skills Education) will improve Outcome: Progressing Goal: Individualized Educational Video(s) Outcome: Progressing   Problem: Coping: Goal: Ability to adjust to condition or change in health will improve Outcome: Progressing   Problem: Fluid Volume: Goal: Ability to maintain a balanced intake and output will improve Outcome: Progressing   Problem: Health Behavior/Discharge Planning: Goal: Ability to identify and utilize available resources and services will improve Outcome: Progressing Goal: Ability to manage health-related needs will improve Outcome: Progressing   Problem: Metabolic: Goal: Ability to maintain appropriate glucose levels will improve Outcome: Progressing   Problem: Nutritional: Goal: Maintenance of adequate nutrition will improve Outcome: Progressing Goal: Progress toward achieving an optimal weight will improve Outcome: Progressing   Problem: Skin Integrity: Goal: Risk for impaired skin integrity will decrease Outcome: Progressing   Problem: Tissue Perfusion: Goal: Adequacy of tissue perfusion will improve Outcome: Progressing   Problem: Education: Goal: Ability to describe self-care measures that may prevent or decrease complications (Diabetes Survival Skills Education) will improve Outcome: Progressing Goal: Individualized Educational Video(s) Outcome: Progressing   Problem: Cardiac: Goal: Ability to maintain an adequate cardiac output will improve Outcome: Progressing    Problem: Health Behavior/Discharge Planning: Goal: Ability to identify and utilize available resources and services will improve Outcome: Progressing Goal: Ability to manage health-related needs will improve Outcome: Progressing   Problem: Fluid Volume: Goal: Ability to achieve a balanced intake and output will improve Outcome: Progressing   Problem: Metabolic: Goal: Ability to maintain appropriate glucose levels will improve Outcome: Progressing   Problem: Nutritional: Goal: Maintenance of adequate nutrition will improve Outcome: Progressing Goal: Maintenance of adequate weight for body size and type will improve Outcome: Progressing  Problem: Respiratory: Goal: Will regain and/or maintain adequate ventilation Outcome: Progressing   Problem: Urinary Elimination: Goal: Ability to achieve and maintain adequate renal perfusion and functioning will improve Outcome: Progressing   "

## 2024-05-13 NOTE — Inpatient Diabetes Management (Addendum)
 Inpatient Diabetes Program Recommendations  AACE/ADA: New Consensus Statement on Inpatient Glycemic Control (2015)  Target Ranges:  Prepandial:   less than 140 mg/dL      Peak postprandial:   less than 180 mg/dL (1-2 hours)      Critically ill patients:  140 - 180 mg/dL   Lab Results  Component Value Date   GLUCAP 303 (H) 05/13/2024   HGBA1C 7.3 (H) 05/11/2024    Review of Glycemic Control  Latest Reference Range & Units 05/12/24 12:58 05/12/24 17:12 05/12/24 20:58 05/13/24 00:08 05/13/24 03:23 05/13/24 07:47  Glucose-Capillary 70 - 99 mg/dL 762 (H) 654 (H) 766 (H) 232 (H) 318 (H) 303 (H)  (H): Data is abnormally high  Diabetes history: DM1(does not make insulin .  Needs correction, basal and meal coverage)   Outpatient Diabetes medications:  Medtronic insulin  pump Basal-23.225 units/day Insulin  Carb ratio-1 unit for every 7 CHO's ISF-20 mg/dL Target 879 mg/dL   Current orders for Inpatient glycemic control: Semglee  increasing to 25 units every day Novolog  0-9 units TID and 0-5 units at bedtime Novolog  3 units TID  Inpatient Diabetes Program Recommendations:    Noted MD increased basal insulin  to 25 units every day today.  Might also consider:  Novolog  6 units TID with meals if he consumes at least 50%  Addendum@11 :20:  Met with family at bedside.  Explained he received steroids for surgery on 12/17 which may be why his glucose trends are running high.  I am afraid if he puts his pump back on, his BG may get even higher.  Family says plan to possibly discharge tomorrow.  Son can help him place pump back on in the morning.  He has his insulin , pump and pump supplies.  Please do not give basal insulin  tomorrow.  Son has T1D as well.    Thank you, Wyvonna Pinal, MSN, CDCES Diabetes Coordinator Inpatient Diabetes Program [phone] (team pager from 8a-5p)

## 2024-05-13 NOTE — Progress Notes (Addendum)
 " PROGRESS NOTE    Eric [name]  FMW:[id number] DOB: 10-13-1940 DOA: 05/11/2024 PCP: Nichole Senior, MD   Brief Narrative:   83 yo M with hx of insulin -dependent diabetes type 1 requiring insulin  pump, HLD, HTN prostate cancer, melanoma, OSA, CKD stage IIIb, Admitted for Ureteral mass, hospitalist was consulted for management of postoperative hyperglycemia in DM1.  S/p (12/17) 1.Cystoscopy with left retrograde pyelogram interpretation and exchange of left ureteral stent. 2.  Left robotic distal ureterectomy with psoas hitch ureteral reimplantation. 3.  Pelvic lymph node dissection.  Started on IV insulin ,transitioned to subcutaneous insulin  on 12/18.  Diabetes coordinator is on board.  Ordered PT  Assessment & Plan:  Principal Problem:   Ureteral mass Active Problems:   Uncontrolled type 1 diabetes mellitus with hyperglycemia, with long-term current use of insulin  (HCC)   Lactic acidosis   Essential hypertension   Hyperlipidemia   DKA, type 1, not at goal [facility])   Insulin  dependent Type 1 diabetes with hyperglycemia,POA: Mild DKA, resolved now -Insulin  pump was turned off in anticipation for the surgical procedure and he didn't get any insulin  resulting in severe hyperglycemia -Started on IV Insulin . Transitioned to subcutaneous insulin  on 12/18 -Started on Insulin  semglee  20 units daily on 12/18, increased to 25 units daily on 12/19. -Novolog  0-9 units TID and 0-5 units at bedtime  -Novolog  6 units TID  (increased from 3 units on 12/19) with meals if he consumes at least 50%  -Patient can be restarted on his insulin  pump tomorrow morning. His son will help him with that. No basal insulin  will be given tomorrow morning for that reason. -Diabetes coordinator on board.  Hyperlipidemia: continue with lipitor  Hypertension: continue with amlodipine   Left distal ureteral cancer, high grade,POA:  s/p 1.Cystoscopy with left retrograde pyelogram interpretation and exchange of  left ureteral stent. 2.  Left robotic distal ureterectomy with psoas hitch ureteral reimplantation. 3.  Pelvic lymph node dissection.  -POD#2  -Management as per Urologist  Likely CKD stage IIIb, POA: f/u with nephrology.  Physical deconditioning: Ordered PT eval  Disposition: Home. Lives with his wife  DVT prophylaxis: SCDs Start: 05/11/24 1934     Code Status: Full Code Family Communication:  Wife and daughter are at the bedside Status is: Inpt    Subjective:  No acute events overnight. Wife and daughter are at the bedside and we spoke about adjusting his insulin  regimen today to achieve a better control of his diabetes. They also told me that his son is on his way from [place] and will arrive around 10:30 am and would like to talk to the diabetes coordinator.  Examination:  General exam: Appears calm and comfortable  Respiratory system: Clear to auscultation. Respiratory effort normal. Cardiovascular system: S1 & S2 heard, RRR. No JVD, murmurs, rubs, gallops or clicks. No pedal edema. Gastrointestinal system: incisions look good Central nervous system: Alert and oriented. No focal neurological deficits. Extremities: Symmetric 5 x 5 power. Skin: No rashes, lesions or ulcers Psychiatry: Judgement and insight appear normal. Mood & affect appropriate.       Diet Orders (From admission, onward)     Start     Ordered   05/12/24 1304  Diet Carb Modified  Diet effective now       Question Answer Comment  Calorie Level Medium 1600-2000   Fluid consistency: Thin      05/12/24 1304            Objective: Vitals:   05/12/24 2008 05/12/24 2335  05/13/24 0406 05/13/24 0735  BP:  (!) 166/52 (!) 162/57 (!) 125/54  Pulse:  69 75 78  Resp:  14 14 18   Temp: 98.1 F (36.7 C) 99.5 F (37.5 C) 99.5 F (37.5 C) 98.9 F (37.2 C)  TempSrc: Oral Oral Oral   SpO2:  99% 98% 100%  Weight:  80.6 kg    Height:        Intake/Output Summary (Last 24 hours) at 05/13/2024  1000 Last data filed at 05/13/2024 0900 Gross per 24 hour  Intake 1374.84 ml  Output 3950 ml  Net -2575.16 ml   Filed Weights   05/11/24 1010 05/12/24 0300 05/12/24 2335  Weight: 79.8 kg 79.9 kg 80.6 kg    Scheduled Meds:  amLODipine   5 mg Oral q AM   atorvastatin   40 mg Oral q AM   Chlorhexidine  Gluconate Cloth  6 each Topical Daily   citalopram   20 mg Oral q AM   docusate sodium   100 mg Oral BID   feeding supplement  1 Container Oral TID BM   insulin  aspart  0-5 Units Subcutaneous QHS   insulin  aspart  0-9 Units Subcutaneous TID WC   insulin  aspart  6 Units Subcutaneous TID WC   insulin  glargine  25 Units Subcutaneous Daily   levothyroxine   75 mcg Oral QAC breakfast   mirtazapine   15 mg Oral QHS   pantoprazole   80 mg Oral Daily   Continuous Infusions:    Nutritional status     Body mass index is 27.83 kg/m.  Data Reviewed:   CBC: Recent Labs  Lab 05/11/24 1617 05/11/24 2139 05/12/24 0731  WBC  --  16.7*  --   NEUTROABS  --  15.4*  --   HGB 12.2* 12.2* 11.1*  HCT 35.8* 37.1* 33.8*  MCV  --  92.1  --   PLT  --  195  --    Basic Metabolic Panel: Recent Labs  Lab 05/12/24 0012 05/12/24 0202 05/12/24 0731 05/12/24 0926 05/12/24 1350 05/13/24 0739  NA 132*  --  136 135 133* 134*  K 4.5  --  4.1 4.3 4.9 5.0  CL 96*  --  101 102 100 100  CO2 20*  --  23 24 24 22   GLUCOSE 366*  --  150* 169* 277* 336*  BUN 27*  --  28* 28* 27* 26*  CREATININE 1.79*  --  1.86* 1.80* 1.74* 1.71*  CALCIUM  8.9  --  8.8* 8.6* 8.8* 8.8*  MG  --  2.3  --   --   --   --   PHOS  --  3.5  --   --   --   --    GFR: Estimated Creatinine Clearance: 34.5 mL/min (A) (by C-G formula based on SCr of 1.71 mg/dL (H)). Liver Function Tests: Recent Labs  Lab 05/11/24 2220  AST 25  ALT 19  ALKPHOS 91  BILITOT 0.5  PROT 6.9  ALBUMIN  4.1   No results for input(s): LIPASE, AMYLASE in the last 168 hours. No results for input(s): AMMONIA in the last 168 hours. Coagulation  Profile: No results for input(s): INR, PROTIME in the last 168 hours. Cardiac Enzymes: No results for input(s): CKTOTAL, CKMB, CKMBINDEX, TROPONINI in the last 168 hours. BNP (last 3 results) No results for input(s): PROBNP in the last 8760 hours. HbA1C: Recent Labs    05/11/24 2139  HGBA1C 7.3*   CBG: Recent Labs  Lab 05/12/24 1712 05/12/24 2058 05/13/24 0008 05/13/24  9676 05/13/24 0747  GLUCAP 345* 233* 232* 318* 303*   Lipid Profile: No results for input(s): CHOL, HDL, LDLCALC, TRIG, CHOLHDL, LDLDIRECT in the last 72 hours. Thyroid  Function Tests: No results for input(s): TSH, T4TOTAL, FREET4, T3FREE, THYROIDAB in the last 72 hours. Anemia Panel: No results for input(s): VITAMINB12, FOLATE, FERRITIN, TIBC, IRON, RETICCTPCT in the last 72 hours. Sepsis Labs: Recent Labs  Lab 05/12/24 0012 05/12/24 0731  LATICACIDVEN 2.3* 2.1*    Recent Results (from the past 240 hours)  MRSA Next Gen by PCR, Nasal     Status: None   Collection Time: 05/12/24  3:00 AM   Specimen: Nasal Mucosa; Nasal Swab  Result Value Ref Range Status   MRSA by PCR Next Gen NOT DETECTED NOT DETECTED Final    Comment: (NOTE) The GeneXpert MRSA Assay (FDA approved for NASAL specimens only), is one component of a comprehensive MRSA colonization surveillance program. It is not intended to diagnose MRSA infection nor to guide or monitor treatment for MRSA infections. Test performance is not FDA approved in patients less than 59 years old. Performed at [facility], [address], [place], [place] [address]          Radiology Studies: DG C-Arm 1-60 Min-No Report Result Date: 05/11/2024 Fluoroscopy was utilized by the requesting physician.  No radiographic interpretation.           LOS: 1 day   Time spent= 35 mins    Deliliah Room, MD [facility]  If 7PM-7AM, please contact night-coverage  05/13/2024,  10:00 AM  "

## 2024-05-13 NOTE — Anesthesia Postprocedure Evaluation (Signed)
"   Anesthesia Post Note  Patient: Eric [name]  Procedure(s) Performed: REIMPLANTATION, URETER, ROBOT-ASSISTED, LAPAROSCOPIC (Abdomen) CYSTOSCOPY WITH INDOCYANINE GREEN  IMAGING (ICG) (Left: Ureter)     Patient location during evaluation: PACU Anesthesia Type: General Level of consciousness: awake and alert Pain management: pain level controlled Vital Signs Assessment: post-procedure vital signs reviewed and stable Respiratory status: spontaneous breathing, nonlabored ventilation, respiratory function stable and patient connected to nasal cannula oxygen  Cardiovascular status: blood pressure returned to baseline and stable Postop Assessment: no apparent nausea or vomiting Anesthetic complications: no   No notable events documented.  Last Vitals:  Vitals:   05/13/24 0406 05/13/24 0735  BP: (!) 162/57 (!) 125/54  Pulse: 75 78  Resp: 14 18  Temp: 37.5 C 37.2 C  SpO2: 98% 100%    Last Pain:  Vitals:   05/13/24 0715  TempSrc:   PainSc: 0-No pain                 Eric [name]      "

## 2024-05-13 NOTE — Progress Notes (Signed)
 2 Days Post-Op   Subjective/Chief Complaint:  1 - LEFT Ureteral Cancer - s/p LEFT robotic distal ureterectomy with psoas hitch reimplant, stent exchange, pelvic node dissection 05/11/24, they day of admission, without acute complication. Admitted to stepdown post-op as sugars challenge to control. POD 1 Cr 1.8 (baseline), Hgb 11, JP removed as output minimal. Med-surg floor POD 2.   2 - Diabetes - type 1 diabetic. Manages with insulin  pump at baseline that his son Eric [name] helps manage. Initial plan of SSI only peri-op, but sugars remained high. Hospitalist team / DM coordinator managing in house with some basal and sliding scale on top peri-op. Carb mod diet POD 1.   Today Eric [name] is slowly improving. Some sundowning overnight as expected as as per baseline, but clear this AM. Hospitalist team and diabetes coordinator graciously helping with glycemic management.   Objective: Vital signs in last 24 hours: Temp:  [97.9 F (36.6 C)-99.5 F (37.5 C)] 98.9 F (37.2 C) (12/19 0735) Pulse Rate:  [57-78] 78 (12/19 0735) Resp:  [12-21] 18 (12/19 0735) BP: (120-166)/(29-57) 125/54 (12/19 0735) SpO2:  [96 %-100 %] 100 % (12/19 0735) Weight:  [80.6 kg] 80.6 kg (12/18 2335) Last BM Date : 05/12/24  Intake/Output from previous day: 12/18 0701 - 12/19 0700 In: 1134.8 [I.V.:1134.8] Out: 4050 [Urine:4000; Drains:50] Intake/Output this shift: Total I/O In: -  Out: 550 [Urine:550]  NAD, daughter and wife in room. Some mild dementia as per baseline, hard of hearling. NLB-RA Resolved mild abd distension (gas), recent port sites c/d/I. Prior JP site with dry dressing.   Foley with clear urine  Lab Results:  Recent Labs    05/11/24 2139 05/12/24 0731  WBC 16.7*  --   HGB 12.2* 11.1*  HCT 37.1* 33.8*  PLT 195  --    BMET Recent Labs    05/12/24 0926 05/12/24 1350  NA 135 133*  K 4.3 4.9  CL 102 100  CO2 24 24  GLUCOSE 169* 277*  BUN 28* 27*  CREATININE 1.80* 1.74*  CALCIUM  8.6*  8.8*   PT/INR No results for input(s): LABPROT, INR in the last 72 hours. ABG Recent Labs    05/12/24 0012  HCO3 23.2    Studies/Results: DG C-Arm 1-60 Min-No Report Result Date: 05/11/2024 Fluoroscopy was utilized by the requesting physician.  No radiographic interpretation.    Anti-infectives: Anti-infectives (From admission, onward)    Start     Dose/Rate Route Frequency Ordered Stop   05/11/24 1030  ceFAZolin  (ANCEF ) IVPB 2g/100 mL premix        2 g 200 mL/hr over 30 Minutes Intravenous 30 min pre-op 05/11/24 0930 05/11/24 1255   05/11/24 0000  sulfamethoxazole -trimethoprim  (BACTRIM  DS) 800-160 MG tablet        1 tablet Oral 2 times daily 05/11/24 1521         Assessment/Plan:  Progressing POD 2 considering hs age and baseline functional status. Continue to GREATLY appreciate hospitalist and DM coordinator teams. Rediscusssed goals for DC. Really just return to ambulation and transitioning back to home DM management only active issues.   OK for DC from Urol perspective at anytime once transitioned back to ome DM management.     Eric [name]. 05/13/2024

## 2024-05-13 NOTE — Progress Notes (Signed)
" °   05/13/24 1502  TOC Brief Assessment  Insurance and Status Reviewed  Patient has primary care physician Yes Maryruth, Garnette, MD)  Home environment has been reviewed From home  Prior level of function: Independent  Prior/Current Home Services No current home services  Social Drivers of Health Review SDOH reviewed no interventions necessary  Readmission risk has been reviewed Yes  Transition of care needs no transition of care needs at this time    "

## 2024-05-13 NOTE — Plan of Care (Signed)
 " Problem: Education: Goal: Knowledge of the procedure and recovery process will improve Outcome: Adequate for Discharge   Problem: Bowel/Gastric: Goal: Gastrointestinal status for postoperative course will improve Outcome: Adequate for Discharge   Problem: Pain Management: Goal: General experience of comfort will improve Outcome: Adequate for Discharge   Problem: Skin Integrity: Goal: Demonstration of wound healing without infection will improve Outcome: Adequate for Discharge   Problem: Urinary Elimination: Goal: Ability to avoid or minimize complications of infection will improve Outcome: Adequate for Discharge Goal: Ability to achieve and maintain urine output will improve Outcome: Adequate for Discharge Goal: Home care management will improve Outcome: Adequate for Discharge   Problem: Education: Goal: Knowledge of General Education information will improve Description: Including pain rating scale, medication(s)/side effects and non-pharmacologic comfort measures Outcome: Adequate for Discharge   Problem: Health Behavior/Discharge Planning: Goal: Ability to manage health-related needs will improve Outcome: Adequate for Discharge   Problem: Clinical Measurements: Goal: Ability to maintain clinical measurements within normal limits will improve Outcome: Adequate for Discharge Goal: Will remain free from infection Outcome: Adequate for Discharge Goal: Diagnostic test results will improve Outcome: Adequate for Discharge Goal: Respiratory complications will improve Outcome: Adequate for Discharge Goal: Cardiovascular complication will be avoided Outcome: Adequate for Discharge   Problem: Activity: Goal: Risk for activity intolerance will decrease Outcome: Adequate for Discharge   Problem: Nutrition: Goal: Adequate nutrition will be maintained Outcome: Adequate for Discharge   Problem: Coping: Goal: Level of anxiety will decrease Outcome: Adequate for Discharge    Problem: Elimination: Goal: Will not experience complications related to bowel motility Outcome: Adequate for Discharge Goal: Will not experience complications related to urinary retention Outcome: Adequate for Discharge   Problem: Pain Managment: Goal: General experience of comfort will improve and/or be controlled Outcome: Adequate for Discharge   Problem: Safety: Goal: Ability to remain free from injury will improve Outcome: Adequate for Discharge   Problem: Skin Integrity: Goal: Risk for impaired skin integrity will decrease Outcome: Adequate for Discharge   Problem: Education: Goal: Ability to describe self-care measures that may prevent or decrease complications (Diabetes Survival Skills Education) will improve Outcome: Adequate for Discharge Goal: Individualized Educational Video(s) Outcome: Adequate for Discharge   Problem: Coping: Goal: Ability to adjust to condition or change in health will improve Outcome: Adequate for Discharge   Problem: Fluid Volume: Goal: Ability to maintain a balanced intake and output will improve Outcome: Adequate for Discharge   Problem: Health Behavior/Discharge Planning: Goal: Ability to identify and utilize available resources and services will improve Outcome: Adequate for Discharge Goal: Ability to manage health-related needs will improve Outcome: Adequate for Discharge   Problem: Metabolic: Goal: Ability to maintain appropriate glucose levels will improve Outcome: Adequate for Discharge   Problem: Nutritional: Goal: Maintenance of adequate nutrition will improve Outcome: Adequate for Discharge Goal: Progress toward achieving an optimal weight will improve Outcome: Adequate for Discharge   Problem: Skin Integrity: Goal: Risk for impaired skin integrity will decrease Outcome: Adequate for Discharge   Problem: Tissue Perfusion: Goal: Adequacy of tissue perfusion will improve Outcome: Adequate for Discharge   Problem:  Education: Goal: Ability to describe self-care measures that may prevent or decrease complications (Diabetes Survival Skills Education) will improve Outcome: Adequate for Discharge Goal: Individualized Educational Video(s) Outcome: Adequate for Discharge   Problem: Cardiac: Goal: Ability to maintain an adequate cardiac output will improve Outcome: Adequate for Discharge   Problem: Health Behavior/Discharge Planning: Goal: Ability to identify and utilize available resources and services will improve Outcome:  Adequate for Discharge Goal: Ability to manage health-related needs will improve Outcome: Adequate for Discharge   Problem: Fluid Volume: Goal: Ability to achieve a balanced intake and output will improve Outcome: Adequate for Discharge   Problem: Metabolic: Goal: Ability to maintain appropriate glucose levels will improve Outcome: Adequate for Discharge   Problem: Nutritional: Goal: Maintenance of adequate nutrition will improve Outcome: Adequate for Discharge Goal: Maintenance of adequate weight for body size and type will improve Outcome: Adequate for Discharge   Problem: Respiratory: Goal: Will regain and/or maintain adequate ventilation Outcome: Adequate for Discharge   Problem: Urinary Elimination: Goal: Ability to achieve and maintain adequate renal perfusion and functioning will improve Outcome: Adequate for Discharge   "

## 2024-05-14 DIAGNOSIS — R531 Weakness: Secondary | ICD-10-CM

## 2024-05-14 DIAGNOSIS — N2889 Other specified disorders of kidney and ureter: Secondary | ICD-10-CM | POA: Diagnosis not present

## 2024-05-14 LAB — BASIC METABOLIC PANEL WITH GFR
Anion gap: 9 (ref 5–15)
BUN: 29 mg/dL — ABNORMAL HIGH (ref 8–23)
CO2: 23 mmol/L (ref 22–32)
Calcium: 8.4 mg/dL — ABNORMAL LOW (ref 8.9–10.3)
Chloride: 101 mmol/L (ref 98–111)
Creatinine, Ser: 1.62 mg/dL — ABNORMAL HIGH (ref 0.61–1.24)
GFR, Estimated: 42 mL/min — ABNORMAL LOW
Glucose, Bld: 396 mg/dL — ABNORMAL HIGH (ref 70–99)
Potassium: 5 mmol/L (ref 3.5–5.1)
Sodium: 134 mmol/L — ABNORMAL LOW (ref 135–145)

## 2024-05-14 LAB — GLUCOSE, CAPILLARY
Glucose-Capillary: 390 mg/dL — ABNORMAL HIGH (ref 70–99)
Glucose-Capillary: 338 mg/dL — ABNORMAL HIGH (ref 70–99)
Glucose-Capillary: 226 mg/dL — ABNORMAL HIGH (ref 70–99)
Glucose-Capillary: 120 mg/dL — ABNORMAL HIGH (ref 70–99)

## 2024-05-14 MED ORDER — INSULIN PUMP
Freq: Three times a day (TID) | SUBCUTANEOUS | Status: DC
  Administered 2024-05-15: 5.2 via SUBCUTANEOUS
  Administered 2024-05-15: 8.6 via SUBCUTANEOUS
  Filled 2024-05-14: qty 1

## 2024-05-14 MED ORDER — ORAL CARE MOUTH RINSE: 15.0000 mL | OROMUCOSAL | Status: DC | PRN

## 2024-05-14 NOTE — Hospital Course (Signed)
 83yo with h/o T1DM on insulin  pump, HTN< HLD, prostate CA, melanoma, OSA, and stage 3b CKD who presented on 12/17 for cystoscopy with ureterectomy and exchange of L ureteral stent.  He developed mild DKA and was started on IV -> SQ insulin .  Plan is for resumption of insulin  pump this AM.

## 2024-05-14 NOTE — Assessment & Plan Note (Addendum)
 Mild DKA, resolved now Insulin  pump was turned off in anticipation for the surgical procedure and he didn't get any insulin  resulting in severe hyperglycemia Started on IV Insulin . Transitioned to subcutaneous insulin  on 12/18 Started on Insulin  semglee  20 units daily on 12/18, increased to 25 units daily on 12/19 He was not given long-acting insulin  today in anticipation of his pump being restarted, but this has just been ordered Diabetes coordinator on board Recommend monitoring for 1 more day with resumption of insulin  pump and increasing mobility

## 2024-05-14 NOTE — Evaluation (Signed)
 Physical Therapy Evaluation Patient Details Name: Eric [name] MRN: [id number] DOB: 02-20-1941 Today's Date: 05/14/2024  History of Present Illness  83 yo M with hx of insulin -dependent diabetes type 1 requiring insulin  pump, HLD, HTN prostate cancer, melanoma, OSA, CKD stage IIIb, Admitted for Ureteral mass, hospitalist was consulted for management of postoperative hyperglycemia in DM1.  Clinical Impression  Pt admitted with above diagnosis. Pt in recliner, family present at bedside and very supportive. Pt previously ind and would mall walk with wife without AD. For mobility today requires CGA/SBA and RW for 181ft amb. He notes onset of dizziness at about 66ft and symptoms abolish with standing rest break, residual tightness in forehead present with return to room, abolishes with rest. BP 150/57, 99% O2 on RA, and 71bpm. Family educated on safety and activity pacing as well as prognosis with endurance and strength. Pt currently with functional limitations due to the deficits listed below (see PT Problem List). Pt will benefit from acute skilled PT to increase their independence and safety with mobility to allow discharge.           If plan is discharge home, recommend the following: A little help with walking and/or transfers;A little help with bathing/dressing/bathroom;Assistance with cooking/housework;Assist for transportation;Help with stairs or ramp for entrance   Can travel by private vehicle        Equipment Recommendations Rolling walker (2 wheels)  Recommendations for Other Services       Functional Status Assessment Patient has had a recent decline in their functional status and demonstrates the ability to make significant improvements in function in a reasonable and predictable amount of time.     Precautions / Restrictions Precautions Precautions: Fall Recall of Precautions/Restrictions: Intact Restrictions Weight Bearing Restrictions Per Provider Order: No       Mobility  Bed Mobility               General bed mobility comments: in recliner when PT arrives    Transfers Overall transfer level: Needs assistance Equipment used: Rolling walker (2 wheels) Transfers: Sit to/from Stand, Bed to chair/wheelchair/BSC Sit to Stand: Supervision           General transfer comment: Pt cued for hand palcement, able to complete STS slowly with good control, good transfer of hands to walker    Ambulation/Gait Ambulation/Gait assistance: Contact guard assist Gait Distance (Feet): 120 Feet Assistive device: Rolling walker (2 wheels) Gait Pattern/deviations: Step-through pattern, Decreased stride length Gait velocity: dec     General Gait Details: Pt amb with CGA/SBA using RW in hallway x162ft, pt reports onset of some dizziness, abolishes <10 sec standing rest, reports tightness in forehead, resolves with sitting, VSS  Stairs Stairs:  (Pt demonstrates functional strength and stability to enter home with CGA, family educated and able to provide this)          Wheelchair Mobility     Tilt Bed    Modified Rankin (Stroke Patients Only)       Balance Overall balance assessment: Needs assistance Sitting-balance support: Feet supported, No upper extremity supported Sitting balance-Leahy Scale: Good     Standing balance support: No upper extremity supported Standing balance-Leahy Scale: Good Standing balance comment: able to stand and don 2nd gown and gait belt no LOB                             Pertinent Vitals/Pain Pain Assessment Pain Assessment: Faces Faces Pain Scale: Hurts  a little bit Pain Location: forehead Pain Descriptors / Indicators: Aching, Tightness Pain Intervention(s): Limited activity within patient's tolerance, Monitored during session, Repositioned    Home Living Family/patient expects to be discharged to:: Private residence Living Arrangements: Spouse/significant other Available Help at  Discharge: Family;Available 24 hours/day;Available PRN/intermittently (family very active with pt) Type of Home: House Home Access: Stairs to enter Entrance Stairs-Rails: None Entrance Stairs-Number of Steps: 2   Home Layout: One level;Laundry or work area in [employer] Equipment: Environmental Education Officer Comments: wife has RW but feels it is a youth walker, will place order for standard height 2 wheeled walker    Prior Function Prior Level of Function : Independent/Modified Independent             Mobility Comments: was mall walking with wife PTA       Extremity/Trunk Assessment   Upper Extremity Assessment Upper Extremity Assessment: Generalized weakness    Lower Extremity Assessment Lower Extremity Assessment: Generalized weakness    Cervical / Trunk Assessment Cervical / Trunk Assessment: Kyphotic  Communication   Communication Factors Affecting Communication: Hearing impaired    Cognition Arousal: Alert Behavior During Therapy: WFL for tasks assessed/performed   PT - Cognitive impairments: No apparent impairments                         Following commands: Intact       Cueing Cueing Techniques: Verbal cues, Gestural cues     General Comments General comments (skin integrity, edema, etc.): Has BM, soft and ribbon like, flatulence present with initial standing    Exercises     Assessment/Plan    PT Assessment Patient needs continued PT services  PT Problem List Decreased strength;Decreased mobility;Decreased activity tolerance;Decreased balance       PT Treatment Interventions DME instruction;Gait training;Stair training;Functional mobility training;Therapeutic activities;Patient/family education;Balance training;Therapeutic exercise    PT Goals (Current goals can be found in the Care Plan section)  Acute Rehab PT Goals Patient Stated Goal: return home and return to mall walking PT Goal Formulation: With patient/family Time For Goal  Achievement: 05/28/24 Potential to Achieve Goals: Good    Frequency Min 2X/week     Co-evaluation               AM-PAC PT 6 Clicks Mobility  Outcome Measure Help needed turning from your back to your side while in a flat bed without using bedrails?: A Little Help needed moving from lying on your back to sitting on the side of a flat bed without using bedrails?: A Little Help needed moving to and from a bed to a chair (including a wheelchair)?: A Little Help needed standing up from a chair using your arms (e.g., wheelchair or bedside chair)?: A Little Help needed to walk in hospital room?: A Little Help needed climbing 3-5 steps with a railing? : A Little 6 Click Score: 18    End of Session Equipment Utilized During Treatment: Gait belt Activity Tolerance: Patient tolerated treatment well;Patient limited by pain Patient left: in chair;with chair alarm set;with family/visitor present;with call bell/phone within reach Nurse Communication: Mobility status PT Visit Diagnosis: Difficulty in walking, not elsewhere classified (R26.2);Muscle weakness (generalized) (M62.81)    Time: 1227-1300 PT Time Calculation (min) (ACUTE ONLY): 33 min   Charges:   PT Evaluation $PT Eval Low Complexity: 1 Low PT Treatments $Gait Training: 8-22 mins PT General Charges $$ ACUTE PT VISIT: 1 Visit  Stann, PT Acute Rehabilitation Services Office: [phone] 05/14/2024   Stann DELENA Ohara 05/14/2024, 1:32 PM

## 2024-05-14 NOTE — Progress Notes (Signed)
 " Progress Note   Patient: Eric [name] FMW:[id number] DOB: [birth year] DOA: 05/11/2024     2 DOS: the patient was seen and examined on 05/14/2024   Brief hospital course: 83yo with h/o T1DM on insulin  pump, HTN< HLD, prostate CA, melanoma, OSA, and stage 3b CKD who presented on 12/17 for cystoscopy with ureterectomy and exchange of L ureteral stent.  He developed mild DKA and was started on IV -> SQ insulin .  Plan is for resumption of insulin  pump this AM.   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 Transitioned to subcutaneous insulin  on 12/18 Started on Insulin  semglee  20 units daily on 12/18, increased to 25 units daily on 12/19 He was not given long-acting insulin  today in anticipation of his pump being restarted, but this has just been ordered Diabetes coordinator on board Recommend monitoring for 1 more day with resumption of insulin  pump and increasing mobility Essential hypertension Continue amlodipine  Hyperlipidemia Continue atorvastatin  Generalized weakness Ambulatory and independent on presentation PT evaluated today and he did ok but is likely to benefit from an additional day in the hospital and repeat PT in AM      Consultants: Urology DM coordinator PT ICM team  Procedures: Cystoscopy with ureterectomy 12/17  Antibiotics: Cefazolin  x 1  30 Day Unplanned Readmission Risk Score    Flowsheet Row Admission (Current) from 05/11/2024 in [place] [facility]  30 Day Unplanned Readmission  Risk Score (%) 19.97 Filed at 05/14/2024 0800    This score is the patient's risk of an unplanned readmission within 30 days of being discharged (0 -100%). The score is based on dignosis, age, lab data, medications, orders, and past utilization.   Low:  0-14.9   Medium: 15-21.9   High: 22-29.9   Extreme: 30 and above           Subjective: Feeling ok.  Frustrated about lack of resumption of insulin  pump plus delay in PT.   Objective: Vitals:   05/14/24 0437 05/14/24 1406  BP: (!) 156/64 (!) 130/55  Pulse: 66 64  Resp: 14 17  Temp: (!) 97.4 F (36.3 C) 98.7 F (37.1 C)  SpO2: 97% 99%    Intake/Output Summary (Last 24 hours) at 05/14/2024 1453 Last data filed at 05/14/2024 1300 Gross per 24 hour  Intake 480 ml  Output 2825 ml  Net -2345 ml   Filed Weights   05/11/24 1010 05/12/24 0300 05/12/24 2335  Weight: 79.8 kg 79.9 kg 80.6 kg    Exam:  General:  Appears calm and comfortable and is in NAD Eyes:  normal lids, iris ENT:  grossly normal hearing, lips & tongue, mmm Cardiovascular:  RRR. No LE edema.  Respiratory:   CTA bilaterally with no wheezes/rales/rhonchi.  Normal respiratory effort. Abdomen:  soft, appropriately tender, ND; surgical wounds well approximated Skin:  no rash or induration seen on limited exam Musculoskeletal:  grossly normal tone BUE/BLE, good ROM, no bony abnormality Psychiatric:  blunted mood and affect, speech fluent and appropriate, AOx3 Neurologic:  CN 2-12 grossly intact, moves all extremities in coordinated  fashion  Data Reviewed: I have reviewed the patient's lab results since admission.  Pertinent labs for today include:   Na++ 134, not clinically significant CO2 23 Glucose 396, 390 BUN 29/Creatinine 1.62/GFR 42, improving    Family Communication: Wife, son were present during evaluation  Mobility: PT/OT Consulted and are recommending - Home Health Pt12/20/2025 1329    Code Status: Full Code   Disposition: Status is:  Inpatient Remains inpatient appropriate because: ongoing monitoring for 1 more day     Time spent: 50 minutes  Unresulted Labs (From admission, onward)     Start     Ordered   05/15/24 0500  CBC  Tomorrow morning,   R       Question:  Specimen collection method  Answer:  Lab=Lab collect   05/14/24 1453   05/15/24 0500  Basic metabolic panel with GFR  Tomorrow morning,   R       Question:  Specimen collection method  Answer:  Lab=Lab collect   05/14/24 1453             Author: Delon Herald, MD 05/14/2024 2:53 PM  For on call review [email or web address].            "

## 2024-05-14 NOTE — Assessment & Plan Note (Signed)
 Ambulatory and independent on presentation PT evaluated today and he did ok but is likely to benefit from an additional day in the hospital and repeat PT in AM

## 2024-05-14 NOTE — Progress Notes (Addendum)
 3 Days Post-Op   Subjective/Chief Complaint:  1 - LEFT Ureteral Cancer - s/p LEFT robotic distal ureterectomy with psoas hitch reimplant, stent exchange, pelvic node dissection 05/11/24, they day of admission, without acute complication. Admitted to stepdown post-op as sugars challenge to control. POD 1 Cr 1.8 (baseline), Hgb 11, JP removed as output minimal. Med-surg floor POD 2.   2 - Diabetes - type 1 diabetic. Manages with insulin  pump at baseline that his son [name] helps manage. Initial plan of SSI only peri-op, but sugars remained high. Hospitalist team / DM coordinator managing in house with some basal and sliding scale on top peri-op. Carb mod diet POD 1.   Today Starr is improving. Tolerating regular diet. Hospitalist team and diabetes coordinator graciously helping with glycemic management.   Objective: Vital signs in last 24 hours: Temp:  [97.4 F (36.3 C)-99.9 F (37.7 C)] 97.4 F (36.3 C) (12/20 0437) Pulse Rate:  [66-78] 66 (12/20 0437) Resp:  [14-18] 14 (12/20 0437) BP: (108-156)/(46-64) 156/64 (12/20 0437) SpO2:  [94 %-98 %] 97 % (12/20 0437) Last BM Date : 05/13/24  Intake/Output from previous day: 12/19 0701 - 12/20 0700 In: 420 [P.O.:420] Out: 2225 [Urine:2225] Intake/Output this shift: No intake/output data recorded.  NAD, daughter and wife in room. Some mild dementia as per baseline, hard of hearling. NLB-RA Resolved mild abd distension (gas), recent port sites c/d/I. Prior JP site with dry dressing.   Foley with clear urine  Lab Results:  Recent Labs    05/11/24 2139 05/12/24 0731  WBC 16.7*  --   HGB 12.2* 11.1*  HCT 37.1* 33.8*  PLT 195  --    BMET Recent Labs    05/13/24 0739 05/14/24 0620  NA 134* 134*  K 5.0 5.0  CL 100 101  CO2 22 23  GLUCOSE 336* 396*  BUN 26* 29*  CREATININE 1.71* 1.62*  CALCIUM  8.8* 8.4*   PT/INR No results for input(s): LABPROT, INR in the last 72 hours. ABG Recent Labs    05/12/24 0012  HCO3 23.2     Studies/Results: No results found.   Anti-infectives: Anti-infectives (From admission, onward)    Start     Dose/Rate Route Frequency Ordered Stop   05/11/24 1030  ceFAZolin  (ANCEF ) IVPB 2g/100 mL premix        2 g 200 mL/hr over 30 Minutes Intravenous 30 min pre-op 05/11/24 0930 05/11/24 1255   05/11/24 0000  sulfamethoxazole -trimethoprim  (BACTRIM  DS) 800-160 MG tablet        1 tablet Oral 2 times daily 05/11/24 1521         Assessment/Plan:  Progressing POD 3 considering his age and baseline functional status. Continue to GREATLY appreciate hospitalist and DM coordinator teams. Rediscusssed goals for DC. Really just return to ambulation and transitioning back to home DM management only active issues.   OK for DC from Urol perspective at anytime once transitioned back to ome DM management.    Maurilio Agar 05/14/2024  I have seen and examined the patient and agree with the above assessment and plan.  Still difficulty mobilizing. Ok to discharge home from GU perspective.   Matt R. Moiz Ryant MD [facility]  Pager: [phone]

## 2024-05-14 NOTE — Assessment & Plan Note (Addendum)
 Continue amlodipine

## 2024-05-14 NOTE — Assessment & Plan Note (Addendum)
 Continue atorvastatin 

## 2024-05-14 NOTE — Assessment & Plan Note (Addendum)
 Left ureteral cancer s/p L ureterectomy, stent exchange, and pelvic node dissection 12/17 Urology is consulting and has cleared for discharge once he is medically ready

## 2024-05-14 NOTE — Plan of Care (Signed)
   Problem: Education: Goal: Knowledge of General Education information will improve Description: Including pain rating scale, medication(s)/side effects and non-pharmacologic comfort measures Outcome: Progressing   Problem: Activity: Goal: Risk for activity intolerance will decrease Outcome: Progressing   Problem: Nutrition: Goal: Adequate nutrition will be maintained Outcome: Progressing

## 2024-05-15 DIAGNOSIS — N1832 Chronic kidney disease, stage 3b: Secondary | ICD-10-CM | POA: Diagnosis present

## 2024-05-15 DIAGNOSIS — N2889 Other specified disorders of kidney and ureter: Secondary | ICD-10-CM | POA: Diagnosis not present

## 2024-05-15 LAB — CBC
HCT: 32.6 % — ABNORMAL LOW (ref 39.0–52.0)
Hemoglobin: 11.2 g/dL — ABNORMAL LOW (ref 13.0–17.0)
MCH: 30.9 pg (ref 26.0–34.0)
MCHC: 34.4 g/dL (ref 30.0–36.0)
MCV: 90.1 fL (ref 80.0–100.0)
Platelets: 182 K/uL (ref 150–400)
RBC: 3.62 MIL/uL — ABNORMAL LOW (ref 4.22–5.81)
RDW: 13.3 % (ref 11.5–15.5)
WBC: 6.8 K/uL (ref 4.0–10.5)
nRBC: 0 % (ref 0.0–0.2)

## 2024-05-15 LAB — BASIC METABOLIC PANEL WITH GFR
Anion gap: 9 (ref 5–15)
BUN: 27 mg/dL — ABNORMAL HIGH (ref 8–23)
CO2: 25 mmol/L (ref 22–32)
Calcium: 9.1 mg/dL (ref 8.9–10.3)
Chloride: 103 mmol/L (ref 98–111)
Creatinine, Ser: 1.53 mg/dL — ABNORMAL HIGH (ref 0.61–1.24)
GFR, Estimated: 45 mL/min — ABNORMAL LOW
Glucose, Bld: 202 mg/dL — ABNORMAL HIGH (ref 70–99)
Potassium: 4.9 mmol/L (ref 3.5–5.1)
Sodium: 137 mmol/L (ref 135–145)

## 2024-05-15 LAB — BLOOD GAS, VENOUS
Acid-base deficit: 2.2 mmol/L — ABNORMAL HIGH (ref 0.0–2.0)
Bicarbonate: 23.2 mmol/L (ref 20.0–28.0)
Drawn by: 69686
O2 Saturation: 44.5 %
Patient temperature: 36.8
pCO2, Ven: 41 mmHg — ABNORMAL LOW (ref 44–60)
pH, Ven: 7.36 (ref 7.25–7.43)
pO2, Ven: <31 mmHg — CL (ref 32–45)

## 2024-05-15 LAB — GLUCOSE, CAPILLARY
Glucose-Capillary: 159 mg/dL — ABNORMAL HIGH (ref 70–99)
Glucose-Capillary: 158 mg/dL — ABNORMAL HIGH (ref 70–99)

## 2024-05-15 MED ORDER — HYDROCODONE-ACETAMINOPHEN 5-325 MG PO TABS: 1.0000 | ORAL_TABLET | Freq: Four times a day (QID) | ORAL | 0 refills | Status: AC | PRN

## 2024-05-15 MED ORDER — SULFAMETHOXAZOLE-TRIMETHOPRIM 800-160 MG PO TABS: 1.0000 | ORAL_TABLET | Freq: Two times a day (BID) | ORAL | 0 refills | Status: AC

## 2024-05-15 NOTE — Assessment & Plan Note (Signed)
 Continue citalopram , mirtazepine

## 2024-05-15 NOTE — Assessment & Plan Note (Addendum)
 Left ureteral cancer s/p L ureterectomy, stent exchange, and pelvic node dissection 12/17 Pathology is pending Urology is consulting and has cleared for discharge Will take Bactrim  BID Norco for pain

## 2024-05-15 NOTE — Assessment & Plan Note (Signed)
 Continue Synthroid 

## 2024-05-15 NOTE — Assessment & Plan Note (Addendum)
 Continue atorvastatin 

## 2024-05-15 NOTE — Assessment & Plan Note (Addendum)
 Ambulatory and independent on presentation PT evaluated, recommended for home PT

## 2024-05-15 NOTE — Assessment & Plan Note (Addendum)
 Continue amlodipine  No longer taking olmesartan

## 2024-05-15 NOTE — Discharge Summary (Signed)
 " Physician Discharge Summary   Patient: Eric [name] MRN: [id number] DOB: 1941-01-26  Admit date:     05/11/2024  Discharge date: 05/15/2024  Discharge Physician: Delon Herald   PCP: Nichole Senior, MD   Recommendations at discharge:   You are being discharged with home physical therapy Follow up with urology as recommended Continue insulin  pump Follow up with Dr. Nichole in 1-2 weeks  Discharge Diagnoses: Principal Problem:   Ureteral mass Active Problems:   Uncontrolled type 1 diabetes mellitus with hyperglycemia, with long-term current use of insulin  (HCC)   Lactic acidosis   Essential hypertension   Hyperlipidemia   DKA, type 1, not at goal [facility])   Generalized weakness   Chronic kidney disease, stage 3b (HCC)   Mood disorder   Hypothyroidism    Hospital Course: 83yo with h/o T1DM on insulin  pump, HTN< HLD, prostate CA, melanoma, OSA, and stage 3b CKD who presented on 12/17 for cystoscopy with ureterectomy and exchange of L ureteral stent.  He developed mild DKA and was started on IV -> SQ insulin .  Plan is for resumption of insulin  pump this AM.  Assessment and Plan:  Assessment & Plan Ureteral mass Left ureteral cancer s/p L ureterectomy, stent exchange, and pelvic node dissection 12/17 Pathology is pending Urology is consulting and has cleared for discharge Will take Bactrim  BID Norco for pain DKA, type 1, not at goal [facility]) Uncontrolled type 1 diabetes mellitus with hyperglycemia, with long-term current use of insulin  (HCC) Mild DKA, resolved now Insulin  pump was turned off in anticipation for the surgical procedure and he didn't get any insulin  resulting in severe hyperglycemia Started on IV Insulin ; transitioned to subcutaneous insulin  on 12/18 and back on insulin  pump on 12/20 Diabetes coordinator consulted Appears to have stabilized Essential hypertension Continue amlodipine  No longer taking olmesartan Hyperlipidemia Continue  atorvastatin  Generalized weakness Ambulatory and independent on presentation PT evaluated, recommended for home PT Chronic kidney disease, stage 3b (HCC) Appears to be stable at this time Attempt to avoid nephrotoxic medications Mood disorder Continue citalopram , mirtazepine Hypothyroidism Continue Synthroid       Consultants: Urology DM coordinator PT ICM team   Procedures: Cystoscopy with ureterectomy 12/17   Antibiotics: Cefazolin  x 1   Pain control - [place]  Controlled Substance Reporting System database was reviewed. and patient was instructed, not to drive, operate heavy machinery, perform activities at heights, swimming or participation in water  activities or provide baby-sitting services while on Pain, Sleep and Anxiety Medications; until their outpatient Physician has advised to do so again. Also recommended to not to take more than prescribed Pain, Sleep and Anxiety Medications.   Disposition: Home Diet recommendation:  Carb modified diet DISCHARGE MEDICATION: Allergies as of 05/15/2024   No Known Allergies      Medication List     STOP taking these medications    aspirin  EC 81 MG tablet   busPIRone 5 MG tablet Commonly known as: BUSPAR   LORazepam 0.5 MG tablet Commonly known as: ATIVAN   olmesartan 20 MG tablet Commonly known as: BENICAR       TAKE these medications    amLODipine  5 MG tablet Commonly known as: NORVASC  Take 5 mg by mouth in the morning.   atorvastatin  40 MG tablet Commonly known as: LIPITOR Take 40 mg by mouth in the morning.   citalopram  20 MG tablet Commonly known as: CELEXA  Take 20 mg by mouth in the morning.   docusate sodium  100 MG capsule Commonly known as: COLACE Take  1 capsule (100 mg total) by mouth 2 (two) times daily.   HumaLOG 100 UNIT/ML injection Generic drug: insulin  lispro Inject into the skin as directed. Up to 115 units a day per sliding scale via insulin  pump    HYDROcodone -acetaminophen  5-325 MG tablet Commonly known as: NORCO/VICODIN Take 1-2 tablets by mouth every 6 (six) hours as needed for moderate pain (pain score 4-6) or severe pain (pain score 7-10).   levothyroxine  75 MCG tablet Commonly known as: SYNTHROID  Take 75 mcg by mouth daily before breakfast.   mirtazapine  15 MG tablet Commonly known as: REMERON  Take 15 mg by mouth at bedtime.   omeprazole 40 MG capsule Commonly known as: PRILOSEC Take 40 mg by mouth daily before breakfast.   sulfamethoxazole -trimethoprim  800-160 MG tablet Commonly known as: BACTRIM  DS Take 1 tablet by mouth 2 (two) times daily. Start the day prior to foley removal appointment        Follow-up Information     Manny, Ricardo KATHEE Raddle., MD Follow up on 05/24/2024.   Specialty: Urology Why: at 12:30 for MD visit, X-Ray, pathology review, and likely catheter removal. Contact information: [address] [place] [address] [phone]         Nichole Senior, MD. Schedule an appointment as soon as possible for a visit in 1 week(s).   Specialty: Endocrinology Contact information: [address] [place] [address] [phone]                Discharge Exam:    Subjective: Feeling better, happy to go home today.   Objective: Vitals:   05/15/24 0506 05/15/24 0904  BP: (!) 179/60 (!) 179/60  Pulse: 64   Resp:    Temp:    SpO2:      Intake/Output Summary (Last 24 hours) at 05/15/2024 1010 Last data filed at 05/15/2024 0600 Gross per 24 hour  Intake 840 ml  Output 3350 ml  Net -2510 ml   Filed Weights   05/11/24 1010 05/12/24 0300 05/12/24 2335  Weight: 79.8 kg 79.9 kg 80.6 kg    Exam:  General:  Appears calm and comfortable and is in NAD Eyes:  normal lids, iris ENT:  grossly normal hearing, lips & tongue, mmm Cardiovascular:  RRR. No LE edema.  Respiratory:   CTA bilaterally with no wheezes/rales/rhonchi.  Normal respiratory effort. Abdomen:  soft, NT, ND;  surgical scars healing well Skin:  no rash or induration seen on limited exam Musculoskeletal:  grossly normal tone BUE/BLE, good ROM, no bony abnormality Psychiatric:  grossly normal mood and affect, speech fluent and appropriate, AOx3 Neurologic:  CN 2-12 grossly intact, moves all extremities in coordinated fashion  Data Reviewed: I have reviewed the patient's lab results since admission.  Pertinent labs for today include:   Glucose 202 BUN 27/Creatinine 1.53/GFR 45, improving WBC 6.8 Hgb 11.2    Condition at discharge: improving  The results of significant diagnostics from this hospitalization (including imaging, microbiology, ancillary and laboratory) are listed below for reference.   Imaging Studies: DG C-Arm 1-60 Min-No Report Result Date: 05/11/2024 Fluoroscopy was utilized by the requesting physician.  No radiographic interpretation.    Microbiology: Results for orders placed or performed during the hospital encounter of 05/11/24  MRSA Next Gen by PCR, Nasal     Status: None   Collection Time: 05/12/24  3:00 AM   Specimen: Nasal Mucosa; Nasal Swab  Result Value Ref Range Status   MRSA by PCR Next Gen NOT DETECTED NOT DETECTED Final  Comment: (NOTE) The GeneXpert MRSA Assay (FDA approved for NASAL specimens only), is one component of a comprehensive MRSA colonization surveillance program. It is not intended to diagnose MRSA infection nor to guide or monitor treatment for MRSA infections. Test performance is not FDA approved in patients less than 61 years old. Performed at [facility], [address] [place], [place] [address]     Labs: CBC: Recent Labs  Lab 05/11/24 1617 05/11/24 2139 05/12/24 0731 05/15/24 0554  WBC  --  16.7*  --  6.8  NEUTROABS  --  15.4*  --   --   HGB 12.2* 12.2* 11.1* 11.2*  HCT 35.8* 37.1* 33.8* 32.6*  MCV  --  92.1  --  90.1  PLT  --  195  --  182   Basic Metabolic Panel: Recent Labs  Lab  05/12/24 0202 05/12/24 0731 05/12/24 0926 05/12/24 1350 05/13/24 0739 05/14/24 0620 05/15/24 0554  NA  --    < > 135 133* 134* 134* 137  K  --    < > 4.3 4.9 5.0 5.0 4.9  CL  --    < > 102 100 100 101 103  CO2  --    < > 24 24 22 23 25   GLUCOSE  --    < > 169* 277* 336* 396* 202*  BUN  --    < > 28* 27* 26* 29* 27*  CREATININE  --    < > 1.80* 1.74* 1.71* 1.62* 1.53*  CALCIUM   --    < > 8.6* 8.8* 8.8* 8.4* 9.1  MG 2.3  --   --   --   --   --   --   PHOS 3.5  --   --   --   --   --   --    < > = values in this interval not displayed.   Liver Function Tests: Recent Labs  Lab 05/11/24 2220  AST 25  ALT 19  ALKPHOS 91  BILITOT 0.5  PROT 6.9  ALBUMIN  4.1   CBG: Recent Labs  Lab 05/14/24 1146 05/14/24 1631 05/14/24 2101 05/15/24 0231 05/15/24 0741  GLUCAP 338* 226* 120* 159* 158*    Discharge time spent: greater than 30 minutes.  Signed: Delon Herald, MD [facility] 05/15/2024 "

## 2024-05-15 NOTE — Progress Notes (Signed)
 Physical Therapy Treatment Patient Details Name: Eric [name] MRN: [id number] DOB: February 03, 1941 Today's Date: 05/15/2024   History of Present Illness 83 yo M with hx of insulin -dependent diabetes type 1 requiring insulin  pump, HLD, HTN prostate cancer, melanoma, OSA, CKD stage IIIb, Admitted for Ureteral mass, hospitalist was consulted for management of postoperative hyperglycemia in DM1.    PT Comments   Pt admitted with above diagnosis.  Pt currently with functional limitations due to the deficits listed below (see PT Problem List). Pt in bed and family as well as nurse present upon PT arrival. Pt is motivated to d/c home today. Pt required increased time, use of hospital bed and S for supine to sit, S for sit to stand  from EOB to RW, gait tasks in hallway 150 feet x 2 with use of RW and min cues with pt progressing from CGA to close S and step navigation with R handrail and CGA with cues. Pt reports slight dizziness and HA with tightness across forehead. Pt left seated in recliner, all needs in place and family present. Pt will benefit from acute skilled PT to increase their independence and safety with mobility to allow discharge.      If plan is discharge home, recommend the following: A little help with walking and/or transfers;A little help with bathing/dressing/bathroom;Assistance with cooking/housework;Assist for transportation;Help with stairs or ramp for entrance   Can travel by private vehicle        Equipment Recommendations  Rolling walker (2 wheels)    Recommendations for Other Services       Precautions / Restrictions Precautions Precautions: Fall Recall of Precautions/Restrictions: Intact Restrictions Weight Bearing Restrictions Per Provider Order: No     Mobility  Bed Mobility Overal bed mobility: Needs Assistance Bed Mobility: Supine to Sit     Supine to sit: Supervision, HOB elevated     General bed mobility comments: min cues and increased time with use  of hospital bed    Transfers Overall transfer level: Needs assistance Equipment used: Rolling walker (2 wheels) Transfers: Sit to/from Stand Sit to Stand: Supervision           General transfer comment: min cues    Ambulation/Gait Ambulation/Gait assistance: Contact guard assist, Supervision Gait Distance (Feet): 150 Feet Assistive device: Rolling walker (2 wheels) Gait Pattern/deviations: Step-through pattern, Decreased stride length Gait velocity: decreased     General Gait Details: min cues for safety, RW management with pt reporting slight dizziness and forehead tighness/HA, pt ed provided on use of RW in home setting for safety, stability and enery conservation. pt stated he did no think he would be able to ambulate on his own yet due to fatigue   Stairs Stairs: Yes Stairs assistance: Contact guard assist Stair Management: One rail Right Number of Stairs: 5 General stair comments: R handrail and L UE HHA when ascending steps step to pattern and min cues, pt able to descend steps with handrail no HHA   Wheelchair Mobility     Tilt Bed    Modified Rankin (Stroke Patients Only)       Balance Overall balance assessment: Needs assistance Sitting-balance support: Feet supported, No upper extremity supported Sitting balance-Leahy Scale: Good     Standing balance support: No upper extremity supported Standing balance-Leahy Scale: Good Standing balance comment: static standing and pt able to take a few steps no UE support and no overt LOB  Communication Communication Communication: Impaired Factors Affecting Communication: Hearing impaired  Cognition Arousal: Alert Behavior During Therapy: WFL for tasks assessed/performed   PT - Cognitive impairments: No apparent impairments                         Following commands: Intact      Cueing Cueing Techniques: Verbal cues, Gestural cues, Visual cues   Exercises      General Comments        Pertinent Vitals/Pain Pain Assessment Pain Assessment: Faces Faces Pain Scale: Hurts a little bit Pain Location: forehead Pain Descriptors / Indicators: Aching, Tightness Pain Intervention(s): Monitored during session    Home Living                          Prior Function            PT Goals (current goals can now be found in the care plan section) Acute Rehab PT Goals Patient Stated Goal: return home and return to mall walking PT Goal Formulation: With patient/family Time For Goal Achievement: 05/28/24 Potential to Achieve Goals: Good Progress towards PT goals: Progressing toward goals    Frequency    Min 2X/week      PT Plan      Co-evaluation              AM-PAC PT 6 Clicks Mobility   Outcome Measure  Help needed turning from your back to your side while in a flat bed without using bedrails?: A Little Help needed moving from lying on your back to sitting on the side of a flat bed without using bedrails?: A Little Help needed moving to and from a bed to a chair (including a wheelchair)?: A Little Help needed standing up from a chair using your arms (e.g., wheelchair or bedside chair)?: A Little Help needed to walk in hospital room?: A Little Help needed climbing 3-5 steps with a railing? : A Little 6 Click Score: 18    End of Session Equipment Utilized During Treatment: Gait belt Activity Tolerance: Patient tolerated treatment well;Patient limited by pain Patient left: in chair;with family/visitor present;with call bell/phone within reach Nurse Communication: Mobility status PT Visit Diagnosis: Difficulty in walking, not elsewhere classified (R26.2);Muscle weakness (generalized) (M62.81)     Time: 8960-8942 PT Time Calculation (min) (ACUTE ONLY): 18 min  Charges:    $Gait Training: 8-22 mins PT General Charges $$ ACUTE PT VISIT: 1 Visit                     [place], PT Acute  Rehab    [place] VEAR Drone 05/15/2024, 11:06 AM

## 2024-05-15 NOTE — Plan of Care (Signed)

## 2024-05-15 NOTE — Assessment & Plan Note (Addendum)
-  Appears to be stable at this time -Attempt to avoid nephrotoxic medications

## 2024-05-15 NOTE — Plan of Care (Signed)
  Problem: Education: Goal: Knowledge of General Education information will improve Description: Including pain rating scale, medication(s)/side effects and non-pharmacologic comfort measures Outcome: Progressing   Problem: Clinical Measurements: Goal: Ability to maintain clinical measurements within normal limits will improve Outcome: Progressing Goal: Will remain free from infection Outcome: Progressing Goal: Respiratory complications will improve Outcome: Progressing Goal: Cardiovascular complication will be avoided Outcome: Progressing   Problem: Health Behavior/Discharge Planning: Goal: Ability to manage health-related needs will improve Outcome: Not Progressing   Problem: Clinical Measurements: Goal: Diagnostic test results will improve Outcome: Not Progressing

## 2024-05-15 NOTE — Assessment & Plan Note (Addendum)
 Mild DKA, resolved now Insulin  pump was turned off in anticipation for the surgical procedure and he didn't get any insulin  resulting in severe hyperglycemia Started on IV Insulin ; transitioned to subcutaneous insulin  on 12/18 and back on insulin  pump on 12/20 Diabetes coordinator consulted Appears to have stabilized

## 2024-05-16 LAB — SURGICAL PATHOLOGY
